# Patient Record
Sex: Male | Born: 1960 | Race: Black or African American | Hispanic: No | Marital: Married | Smoking: Former smoker
Health system: Southern US, Community
[De-identification: ages and names within clinical notes are randomized; demographics above are authoritative.]

## PROBLEM LIST (undated history)

## (undated) DIAGNOSIS — Z789 Other specified health status: Secondary | ICD-10-CM

## (undated) HISTORY — PX: OTHER SURGICAL HISTORY: SHX169

## (undated) HISTORY — PX: NO PAST SURGERIES: SHX2092

## (undated) HISTORY — DX: Other specified health status: Z78.9

---

## 2021-07-07 DIAGNOSIS — R7612 Nonspecific reaction to cell mediated immunity measurement of gamma interferon antigen response without active tuberculosis: Secondary | ICD-10-CM

## 2021-07-07 HISTORY — DX: Nonspecific reaction to cell mediated immunity measurement of gamma interferon antigen response without active tuberculosis: R76.12

## 2021-07-12 ENCOUNTER — Telehealth: Payer: Self-pay

## 2021-07-12 NOTE — Telephone Encounter (Signed)
Calling pt regarding positive T-Spot, Epi, Chest x-ray, LTBI. ?Will be transferring to [facility]. ?

## 2021-07-13 NOTE — Telephone Encounter (Addendum)
Phone call to pt with language line swahili interpreter.  ? ?Epi completed 07/14/21 - see document. ? ?Chest x-ray ordered. ? ?Pt is interested in LTBI. ? ?Per pt request, contacted case worker, Garlan Fillers, [place], [phone].  Left message requesting help getting pt transported to [facility] for Chest x-ray, address provided, and [facility] contact info supplied. ?

## 2021-07-14 ENCOUNTER — Ambulatory Visit (LOCAL_COMMUNITY_HEALTH_CENTER): Payer: Self-pay

## 2021-07-14 DIAGNOSIS — R7612 Nonspecific reaction to cell mediated immunity measurement of gamma interferon antigen response without active tuberculosis: Secondary | ICD-10-CM

## 2021-07-14 NOTE — Progress Notes (Signed)
The information obtained and confirmed in this document was obtained during a phone interview with pt. ? ?Pt unsure of ht and wt. ? ?Not taking any medications or vitamins. ? ?Chest x-ray ordered. ? ?Pt requests assistance with getting transportation set up with John at [facility] for chest x-ray. ?Phone call to Garlan Fillers, C[facility], [phone]. ? ?Discussed active vs latent TB. LTBI medication discussed. Pt is interested in LTBI medication, if applicable. ?

## 2021-07-22 ENCOUNTER — Telehealth: Payer: Self-pay | Admitting: Surgery

## 2021-07-22 DIAGNOSIS — R7612 Nonspecific reaction to cell mediated immunity measurement of gamma interferon antigen response without active tuberculosis: Secondary | ICD-10-CM

## 2021-07-22 NOTE — Telephone Encounter (Signed)
Asked case worker from [place] if he could help with transportation of patient to [facility] for CXR. Order is in system, can walk in anytime. John B said he would help patient get there next week. ? ?+T-Spot 07/07/2021 ?EPI 07/14/2021 ?Needs CXR ?Offer LTBI after CXR assuming negative ? ?Leigh Aurora, MD  ?

## 2021-07-27 ENCOUNTER — Ambulatory Visit
Admission: RE | Admit: 2021-07-27 | Discharge: 2021-07-27 | Disposition: A | Discharge status: Home / Self Care | Payer: Medicaid Other | Attending: Family Medicine | Admitting: Family Medicine

## 2021-07-27 ENCOUNTER — Ambulatory Visit
Admission: RE | Admit: 2021-07-27 | Discharge: 2021-07-27 | Disposition: A | Discharge status: Home / Self Care | Payer: Medicaid Other | Source: Ambulatory Visit | Attending: Family Medicine | Admitting: Family Medicine

## 2021-07-27 DIAGNOSIS — R7612 Nonspecific reaction to cell mediated immunity measurement of gamma interferon antigen response without active tuberculosis: Secondary | ICD-10-CM | POA: Insufficient documentation

## 2021-08-03 ENCOUNTER — Other Ambulatory Visit: Payer: Self-pay | Admitting: Surgery

## 2021-08-03 ENCOUNTER — Telehealth: Payer: Self-pay | Admitting: Surgery

## 2021-08-03 DIAGNOSIS — R7612 Nonspecific reaction to cell mediated immunity measurement of gamma interferon antigen response without active tuberculosis: Secondary | ICD-10-CM

## 2021-08-03 NOTE — Progress Notes (Signed)
+  QFT: T-spot 07/07/2021 ?CXR: negative 07/27/2021 ?EPI: 07/14/2021 ? ?HIV: neg 07/07/2021 ?Syphilis: neg 07/07/2021 ? ?Tuberculosis treatment orders  ?All patients are to be monitored per [place] and county TB policies.   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 Treat for latent TB per the following: ? ?Rifampin 600mg  daily by mouth x 4 months per Dr. Ernestina Patches.  ? ?No monthly or baseline labs currently indicated. Only needs labs if concerning symptoms arise or new medications reported that would require monitoring. ? ?Leigh Aurora, MD  ?

## 2021-08-03 NOTE — Telephone Encounter (Signed)
Swahili interpreter used. ? ?Explained active versus latent TB, and informed patient of having a negative CXR and no evidence of active TB disease. Explained to the patient that he/she is not sick nor contagious with TB. ? ?Offered treatment for latent TB to ensure patient does not become sick in the future with active TB.  ? ?Explained it would be a 4 month course taking two capsules of an antibiotic, Rifampin, every day.  ? ?Explained the medication, office visits and any necessary imaging or labs would be of no cost to the patient.  ? ?The patient would like to proceed with treatment for latent TB.  ? ?Appointment is scheduled for May 3rd, at 10am. ? ?Jennye Moccasin, MD  ?

## 2021-08-10 ENCOUNTER — Ambulatory Visit (LOCAL_COMMUNITY_HEALTH_CENTER): Payer: Medicaid Other | Admitting: Surgery

## 2021-08-10 VITALS — Wt 148.0 lb

## 2021-08-10 DIAGNOSIS — R7612 Nonspecific reaction to cell mediated immunity measurement of gamma interferon antigen response without active tuberculosis: Secondary | ICD-10-CM

## 2021-08-10 MED ORDER — RIFAMPIN 300 MG PO CAPS: 600.0000 mg | ORAL_CAPSULE | Freq: Every day | ORAL | 0 refills | Status: AC

## 2021-08-10 NOTE — Progress Notes (Signed)
Interpreter: Language line, Swahili ? ?The patient is a 61yo male with latent TB. He is here with his son who is also + for latent TB. ? ?Patient wants to start tx for LTBI with Rifampin 600mg  daily for 4 months. Patient has signed agreements to treat and obtain labs if necessary. ? ?Dispensed Rifampin 300mg , #60 at today's visit.  ? ?Potential side effects discussed, patient information sheet given along with contact number. Patient advised to refrain from drinking alcohol during treatment and if sexually active, to use additional birth control (condoms) since rifampin can reduce the efficacy of contraception.  ? ?If any concerning side effects arise patient instructed to stop medication and contact TB staff immediately.  ? ?The patient's next visit is scheduled for 09/07/2021 at 9:30am. ? ?The patient needs a taxi scheduled for transportation for their next visit to and from the health department.  ? ? , MD  ?

## 2021-09-07 ENCOUNTER — Ambulatory Visit (LOCAL_COMMUNITY_HEALTH_CENTER): Payer: Medicaid Other | Admitting: Surgery

## 2021-09-07 VITALS — Wt 156.5 lb

## 2021-09-07 DIAGNOSIS — R7612 Nonspecific reaction to cell mediated immunity measurement of gamma interferon antigen response without active tuberculosis: Secondary | ICD-10-CM

## 2021-09-07 MED ORDER — RIFAMPIN 300 MG PO CAPS: 600.0000 mg | ORAL_CAPSULE | Freq: Every day | ORAL | 0 refills | Status: AC

## 2021-09-07 NOTE — Progress Notes (Unsigned)
___Swahili__  language; used language line   Patient has been taking Rifampin 600 mg daily for 1 month for LTBI treatment.  Patient is doing well on current medication regimen. No n/v/f/c, eating normally, no concerning weight loss. Patient reports taking medications daily as prescribed.  Patient does report itching all over and rash for the last two weeks. Informed patient I could not be sure Rifampin is causing itching/rash but is definitely possible and if can continue taking Rifampin despite side effects, we should try. I educated patient about over the counter anti-itching remedies including topical 1% hydrocortisone and oral diphenhydramine 25mg  tablets. I printed out examples of the medication, where to buy them ([facility] or other pharmacy) and how to administer. Patient is concerned about cost, will be important to get patient established with a PCP.  Gave patient information about [facility] which can provide medical and mental health services. Gave patient and his son paper applications and told patient I will call [facility] to see if I can assist in arranging first meeting with staff and an interpreter so he can complete the application and establish care.   Patient has about 4 pills left. Patient advised to finish the remaining pills in their old bottle, then start on the next bottle of medication.  Dispensed #2 month of Rifampin for LTBI tx. Dispensed #60, 300 mg capsules.   I provided counseling today regarding the medication, we discussed the medication, the side effects and when to call clinic. Patient given the opportunity to ask questions.   Patient advised to contact [facility]/TB control phone for any concerning symptoms or questions.  Patient's next visit has been scheduled for:  June 29th at 10am  Contact patient 1 week ahead of time to confirm appointment and reschedule if necessary.  Will need taxi scheduled for transportation.   July 01, MD

## 2021-09-21 ENCOUNTER — Ambulatory Visit: Payer: Self-pay | Admitting: Gerontology

## 2021-10-06 ENCOUNTER — Ambulatory Visit (LOCAL_COMMUNITY_HEALTH_CENTER): Payer: Medicaid Other | Admitting: Surgery

## 2021-10-06 VITALS — Wt 147.5 lb

## 2021-10-06 DIAGNOSIS — R7612 Nonspecific reaction to cell mediated immunity measurement of gamma interferon antigen response without active tuberculosis: Secondary | ICD-10-CM | POA: Diagnosis not present

## 2021-10-06 MED ORDER — RIFAMPIN 300 MG PO CAPS: 600.0000 mg | ORAL_CAPSULE | Freq: Every day | ORAL | 0 refills | Status: AC

## 2021-10-06 NOTE — Progress Notes (Signed)
Swahili language; used language line  Patient has been taking Rifampin 600 mg daily for 2 months for LTBI treatment.  Patient is doing well on current medication regimen. No n/v/f/c, eating normally, no concerning weight loss. Patient reports taking medications daily as prescribed.  Patient did report rash and itching at last visit and was advised to try OTC medicine such as topical hydrocortisone cream and/or benadryl. He reports itching and rash much better with topical cream. Also reports some dry throat, at times sputums, sounds like allergies, also some chest tightness. But no cough, no fevers or chills. Discussed again establishing care with [facility] for primary care needs, he may have already gone but doesn't seem to understand how to address minor medical problems with a PCP. Will consult staff and get back to him.  Patient has 6 pills left. Patient advised to finish the remaining pills in their old bottle, then start on the next bottle of medication.  Dispensed #3 month of Rifampin for LTBI tx. Dispensed #60, 300 mg capsules.   I provided counseling today regarding the medication, we discussed the medication, the side effects and when to call clinic. Patient given the opportunity to ask questions.   Patient advised to contact [facility]/TB control phone for any concerning symptoms or questions.  Patient's next visit has been scheduled for: Thursday July 27th at 10:30 am  Contact patient 1 week ahead of time to confirm appointment and reschedule if necessary.  Will need taxi scheduled for transportation.  Jennye Moccasin, MD

## 2021-11-04 ENCOUNTER — Ambulatory Visit (LOCAL_COMMUNITY_HEALTH_CENTER): Payer: Medicaid Other | Admitting: Surgery

## 2021-11-04 VITALS — Wt 147.0 lb

## 2021-11-04 DIAGNOSIS — R7612 Nonspecific reaction to cell mediated immunity measurement of gamma interferon antigen response without active tuberculosis: Secondary | ICD-10-CM | POA: Diagnosis not present

## 2021-11-04 MED ORDER — RIFAMPIN 300 MG PO CAPS: 600.0000 mg | ORAL_CAPSULE | Freq: Every day | ORAL | 0 refills | Status: AC

## 2021-11-04 NOTE — Progress Notes (Signed)
Language: Swahili; Language line    Patient has been taking Rifampin 600 mg daily for 3 months for LTBI treatment.  Patient is doing well on current medication regimen. No n/v/f/c, eating normally, occasional loss of appetite but no concerning weight loss. Patient reports feeling of tightness in chest/trouble breathing once in a while, happens at rest, not during exertion. He has reported this sensation before, doesn't interfere with daily living, has been advised to follow up with PCP if continues to recur or gets worse. Patient reports taking medications daily as prescribed, patient has 5 more days of pills. The patient was advised to complete all of these pills before starting the next bottle.  Dispensed #4 and final month of Rifampin for LTBI tx. Dispensed #60, 300 mg capsules.   Explained to patient that will always be positive on PPD skin test and blood test for exposure to TB. If patient is asked by employer, school, or other institution to take a TB test, patient should supply proof of treatment completion and/or obtain a TB Screening at the health department or at patient's PCP. This information was also provided in writing in the patient's completion letter which was given to the patient today along with a TB treatment completion card.   All completion letters and completion card were sent for scanning into Epic, along with the patient's TB drug record ([id number]).  Patient was advised to contact [facility]/TB control phone for any concerning symptoms or questions.    Jennye Moccasin, MD

## 2022-03-08 ENCOUNTER — Ambulatory Visit: Payer: Medicaid Other

## 2022-03-11 ENCOUNTER — Emergency Department: Payer: Medicaid Other

## 2022-03-11 ENCOUNTER — Emergency Department
Admission: EM | Admit: 2022-03-11 | Discharge: 2022-03-11 | Disposition: A | Discharge status: Home / Self Care | Payer: Medicaid Other | Attending: Emergency Medicine | Admitting: Emergency Medicine

## 2022-03-11 DIAGNOSIS — R0789 Other chest pain: Secondary | ICD-10-CM | POA: Insufficient documentation

## 2022-03-11 DIAGNOSIS — Z4802 Encounter for removal of sutures: Secondary | ICD-10-CM

## 2022-03-11 DIAGNOSIS — R519 Headache, unspecified: Secondary | ICD-10-CM | POA: Insufficient documentation

## 2022-03-11 DIAGNOSIS — M79641 Pain in right hand: Secondary | ICD-10-CM | POA: Diagnosis not present

## 2022-03-11 DIAGNOSIS — M542 Cervicalgia: Secondary | ICD-10-CM | POA: Diagnosis not present

## 2022-03-11 LAB — CBC WITH DIFFERENTIAL/PLATELET
Abs Immature Granulocytes: 0.01 10*3/uL (ref 0.00–0.07)
Basophils Absolute: 0 10*3/uL (ref 0.0–0.1)
Basophils Relative: 0 %
Eosinophils Absolute: 0.2 10*3/uL (ref 0.0–0.5)
Eosinophils Relative: 3 %
HCT: 44.5 % (ref 39.0–52.0)
Hemoglobin: 14.5 g/dL (ref 13.0–17.0)
Immature Granulocytes: 0 %
Lymphocytes Relative: 50 %
Lymphs Abs: 2.6 10*3/uL (ref 0.7–4.0)
MCH: 29 pg (ref 26.0–34.0)
MCHC: 32.6 g/dL (ref 30.0–36.0)
MCV: 89 fL (ref 80.0–100.0)
Monocytes Absolute: 0.4 10*3/uL (ref 0.1–1.0)
Monocytes Relative: 7 %
Neutro Abs: 2.2 10*3/uL (ref 1.7–7.7)
Neutrophils Relative %: 40 %
Platelets: 273 10*3/uL (ref 150–400)
RBC: 5 MIL/uL (ref 4.22–5.81)
RDW: 11.4 % — ABNORMAL LOW (ref 11.5–15.5)
WBC: 5.3 10*3/uL (ref 4.0–10.5)
nRBC: 0 % (ref 0.0–0.2)

## 2022-03-11 LAB — BASIC METABOLIC PANEL
Anion gap: 5 (ref 5–15)
BUN: 16 mg/dL (ref 6–20)
CO2: 27 mmol/L (ref 22–32)
Calcium: 9.3 mg/dL (ref 8.9–10.3)
Chloride: 105 mmol/L (ref 98–111)
Creatinine, Ser: 1.23 mg/dL (ref 0.61–1.24)
GFR, Estimated: >60 mL/min (ref >60)
Glucose, Bld: 149 mg/dL — ABNORMAL HIGH (ref 70–99)
Potassium: 4.6 mmol/L (ref 3.5–5.1)
Sodium: 137 mmol/L (ref 135–145)

## 2022-03-11 LAB — TROPONIN I (HIGH SENSITIVITY): Troponin I (High Sensitivity): 4 ng/L (ref <18)

## 2022-03-11 MED ORDER — MORPHINE SULFATE (PF) 4 MG/ML IV SOLN
4.0000 mg | Freq: Once | INTRAVENOUS | Status: AC
  Administered 2022-03-11: 4 mg via INTRAVENOUS
  Filled 2022-03-11: qty 1

## 2022-03-11 MED ORDER — LIDOCAINE 5 % EX PTCH: 1.0000 | MEDICATED_PATCH | Freq: Two times a day (BID) | CUTANEOUS | 0 refills | Status: AC

## 2022-03-11 MED ORDER — IBUPROFEN 600 MG PO TABS: 600.0000 mg | ORAL_TABLET | Freq: Four times a day (QID) | ORAL | 0 refills | Status: AC | PRN

## 2022-03-11 MED ORDER — IOHEXOL 300 MG/ML  SOLN
100.0000 mL | Freq: Once | INTRAMUSCULAR | Status: AC | PRN
  Administered 2022-03-11: 100 mL via INTRAVENOUS

## 2022-03-11 NOTE — Discharge Instructions (Addendum)
IMPRESSION: 1. No acute localizing process in the chest. 2. 1.8 cm incidental right thyroid nodule. Recommend non-emergent thyroid ultrasound. Reference: J Am Coll Radiol. 2015 Feb;12(2): 143-50  Your workup was reassuring you can take Tylenol 1 g every 8 hours and ibuprofen to help with pain.  Use the pain patches.
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
## 2022-03-11 NOTE — ED Notes (Signed)
Pt sats 89% on RA. Pt wearing [place] with 0L upon RN entrance. Placed on 2L with no change, placed on 4L and at 91%.

## 2022-03-11 NOTE — ED Notes (Signed)
Used translator [phone] for discharge instructions. Vitals stable. All of pts questions answered prior to discharge.

## 2022-03-31 ENCOUNTER — Ambulatory Visit (LOCAL_COMMUNITY_HEALTH_CENTER): Payer: Medicaid Other

## 2022-03-31 DIAGNOSIS — Z23 Encounter for immunization: Secondary | ICD-10-CM | POA: Diagnosis not present

## 2022-03-31 DIAGNOSIS — Z719 Counseling, unspecified: Secondary | ICD-10-CM

## 2022-03-31 NOTE — Progress Notes (Signed)
[employer] Language line used, Interpreter badge number:  [id number] Patient seen for the following immunizations: Flu, Hep B, Hep A, Polio SQ VIS forms given. [facility] immunization copy given. After care reviewed.  

## 2022-04-21 ENCOUNTER — Other Ambulatory Visit: Payer: Self-pay | Admitting: Family Medicine

## 2022-04-21 DIAGNOSIS — E041 Nontoxic single thyroid nodule: Secondary | ICD-10-CM

## 2022-04-27 ENCOUNTER — Encounter: Payer: Self-pay | Admitting: Emergency Medicine

## 2022-04-27 ENCOUNTER — Other Ambulatory Visit: Payer: Self-pay

## 2022-04-27 ENCOUNTER — Emergency Department
Admission: EM | Admit: 2022-04-27 | Discharge: 2022-04-27 | Disposition: A | Discharge status: Home / Self Care | Payer: Medicaid Other | Attending: Emergency Medicine | Admitting: Emergency Medicine

## 2022-04-27 DIAGNOSIS — S199XXA Unspecified injury of neck, initial encounter: Secondary | ICD-10-CM | POA: Diagnosis present

## 2022-04-27 DIAGNOSIS — S161XXA Strain of muscle, fascia and tendon at neck level, initial encounter: Secondary | ICD-10-CM | POA: Diagnosis not present

## 2022-04-27 MED ORDER — HYDROXYZINE HCL 10 MG PO TABS: 10.0000 mg | ORAL_TABLET | Freq: Three times a day (TID) | ORAL | 0 refills | Status: DC | PRN

## 2022-04-27 MED ORDER — PREDNISONE 10 MG (21) PO TBPK: ORAL_TABLET | ORAL | 0 refills | Status: DC
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
## 2022-05-05 ENCOUNTER — Ambulatory Visit: Payer: Medicaid Other

## 2022-05-05 ENCOUNTER — Ambulatory Visit (LOCAL_COMMUNITY_HEALTH_CENTER): Payer: Medicaid Other

## 2022-05-05 DIAGNOSIS — Z23 Encounter for immunization: Secondary | ICD-10-CM

## 2022-05-05 NOTE — Progress Notes (Signed)
[employer] Language line used, Interpreter agent name and badge number: Beverlee Nims [phone]. Patient seen for the following immunizations: IPV VIS forms given. [facility] immunization copy given. Tolerated well.

## 2022-08-29 ENCOUNTER — Ambulatory Visit: Payer: Self-pay | Admitting: *Deleted
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
## 2022-10-11 ENCOUNTER — Ambulatory Visit: Payer: Medicaid Other | Admitting: Physician Assistant

## 2022-10-11 ENCOUNTER — Encounter: Payer: Self-pay | Admitting: Physician Assistant

## 2022-10-11 VITALS — BP 115/81 | HR 10 | Ht 66.93 in | Wt 153.5 lb

## 2022-10-11 DIAGNOSIS — R1031 Right lower quadrant pain: Secondary | ICD-10-CM | POA: Diagnosis not present

## 2022-10-11 DIAGNOSIS — R109 Unspecified abdominal pain: Secondary | ICD-10-CM

## 2022-10-11 DIAGNOSIS — R5383 Other fatigue: Secondary | ICD-10-CM

## 2022-10-11 DIAGNOSIS — R102 Pelvic and perineal pain: Secondary | ICD-10-CM

## 2022-10-11 DIAGNOSIS — Z136 Encounter for screening for cardiovascular disorders: Secondary | ICD-10-CM | POA: Diagnosis not present

## 2022-10-11 DIAGNOSIS — R81 Glycosuria: Secondary | ICD-10-CM

## 2022-10-11 LAB — POCT URINALYSIS DIPSTICK
Bilirubin, UA: NEGATIVE
Blood, UA: NEGATIVE
Glucose, UA: POSITIVE — AB
Ketones, UA: NEGATIVE
Nitrite, UA: POSITIVE
Protein, UA: NEGATIVE
Spec Grav, UA: 1.01 (ref 1.010–1.025)
Urobilinogen, UA: 0.2 E.U./dL
pH, UA: 6 (ref 5.0–8.0)
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
## 2022-10-13 ENCOUNTER — Encounter: Payer: Self-pay | Admitting: Physician Assistant

## 2022-10-13 DIAGNOSIS — R109 Unspecified abdominal pain: Secondary | ICD-10-CM

## 2022-10-13 DIAGNOSIS — R1031 Right lower quadrant pain: Secondary | ICD-10-CM

## 2022-10-13 DIAGNOSIS — R81 Glycosuria: Secondary | ICD-10-CM | POA: Insufficient documentation

## 2022-10-13 DIAGNOSIS — R5383 Other fatigue: Secondary | ICD-10-CM | POA: Insufficient documentation

## 2022-10-13 HISTORY — DX: Right lower quadrant pain: R10.31

## 2022-10-13 HISTORY — DX: Unspecified abdominal pain: R10.9

## 2022-10-17 LAB — CBC WITH DIFFERENTIAL/PLATELET
Basophils Absolute: 0 10*3/uL (ref 0.0–0.2)
Basos: 1 %
EOS (ABSOLUTE): 0.2 10*3/uL (ref 0.0–0.4)
Eos: 3 %
Hematocrit: 42.4 % (ref 37.5–51.0)
Hemoglobin: 14.2 g/dL (ref 13.0–17.7)
Immature Grans (Abs): 0 10*3/uL (ref 0.0–0.1)
Immature Granulocytes: 0 %
Lymphocytes Absolute: 2.6 10*3/uL (ref 0.7–3.1)
Lymphs: 46 %
MCH: 29.3 pg (ref 26.6–33.0)
MCHC: 33.5 g/dL (ref 31.5–35.7)
MCV: 87 fL (ref 79–97)
Monocytes Absolute: 0.4 10*3/uL (ref 0.1–0.9)
Monocytes: 7 %
Neutrophils Absolute: 2.3 10*3/uL (ref 1.4–7.0)
Neutrophils: 43 %
Platelets: 221 10*3/uL (ref 150–450)
RBC: 4.85 x10E6/uL (ref 4.14–5.80)
RDW: 11.4 % — ABNORMAL LOW (ref 11.6–15.4)
WBC: 5.5 10*3/uL (ref 3.4–10.8)

## 2022-10-17 LAB — COMPREHENSIVE METABOLIC PANEL
ALT: 18 IU/L (ref 0–44)
AST: 17 IU/L (ref 0–40)
Albumin: 4.3 g/dL (ref 3.9–4.9)
Alkaline Phosphatase: 100 IU/L (ref 44–121)
BUN/Creatinine Ratio: 13 (ref 10–24)
BUN: 16 mg/dL (ref 8–27)
Bilirubin Total: 0.9 mg/dL (ref 0.0–1.2)
CO2: 23 mmol/L (ref 20–29)
Calcium: 9.7 mg/dL (ref 8.6–10.2)
Chloride: 99 mmol/L (ref 96–106)
Creatinine, Ser: 1.2 mg/dL (ref 0.76–1.27)
Globulin, Total: 3.6 g/dL (ref 1.5–4.5)
Glucose: 306 mg/dL — ABNORMAL HIGH (ref 70–99)
Potassium: 4.4 mmol/L (ref 3.5–5.2)
Sodium: 137 mmol/L (ref 134–144)
Total Protein: 7.9 g/dL (ref 6.0–8.5)
eGFR: 69 mL/min/{1.73_m2} (ref >59)

## 2022-10-17 LAB — HEMOGLOBIN A1C
Est. average glucose Bld gHb Est-mCnc: 212 mg/dL
Hgb A1c MFr Bld: 9 % — ABNORMAL HIGH (ref 4.8–5.6)

## 2022-10-17 LAB — TSH: TSH: 3.16 u[IU]/mL (ref 0.450–4.500)

## 2022-10-18 ENCOUNTER — Other Ambulatory Visit: Payer: Self-pay | Admitting: Physician Assistant

## 2022-10-18 DIAGNOSIS — E119 Type 2 diabetes mellitus without complications: Secondary | ICD-10-CM

## 2022-10-18 MED ORDER — METFORMIN HCL 500 MG PO TABS: 500.0000 mg | ORAL_TABLET | Freq: Two times a day (BID) | ORAL | 1 refills | Status: DC

## 2022-10-18 NOTE — Progress Notes (Signed)
(  Needs translator)Please, let pt know that he has diabetes and a med will be sent to his pharmacy? He needs to schedule an appt in a mo or 6 weeks.

## 2022-10-19 ENCOUNTER — Telehealth: Payer: Self-pay | Admitting: Physician Assistant

## 2022-10-19 NOTE — Progress Notes (Signed)
(  Translator from/to Swahili) Pt needs to start Metformin for his newly diagnosed diabetes. Please, disregard previous messages to p Shealynn Saulnier nurse. Need a fu appt in 6 weeks.

## 2022-10-19 NOTE — Telephone Encounter (Signed)
Using Swahili interpreter [place], # [phone], given lab results and instructions. Follow up appointment made.

## 2022-10-24 ENCOUNTER — Ambulatory Visit
Admission: RE | Admit: 2022-10-24 | Discharge: 2022-10-24 | Disposition: A | Discharge status: Home / Self Care | Payer: Medicaid Other | Source: Ambulatory Visit | Attending: Physician Assistant | Admitting: Physician Assistant

## 2022-10-24 DIAGNOSIS — R1031 Right lower quadrant pain: Secondary | ICD-10-CM | POA: Diagnosis present

## 2022-10-24 DIAGNOSIS — R109 Unspecified abdominal pain: Secondary | ICD-10-CM | POA: Diagnosis present

## 2022-10-24 DIAGNOSIS — R102 Pelvic and perineal pain: Secondary | ICD-10-CM

## 2022-10-24 NOTE — Progress Notes (Signed)
Translator. Please, let pt know normal right upper quadrant [place].

## 2022-10-26 ENCOUNTER — Other Ambulatory Visit: Payer: Self-pay | Admitting: Physician Assistant

## 2022-10-31 NOTE — Progress Notes (Signed)
(  Translator from to swahili) This is a repeated message. Pt needs to do urine sample again for urine culture

## 2022-11-08 ENCOUNTER — Telehealth: Payer: Self-pay | Admitting: Physician Assistant

## 2022-11-08 NOTE — Telephone Encounter (Signed)
Pt's niece Vivia Ewing called requesting to receive a diabetic testing machine to   [facility] [address] [place] - [address]  [address] [place] [address]  Phone: [phone] Fax: [phone]

## 2022-11-09 ENCOUNTER — Other Ambulatory Visit: Payer: Self-pay

## 2022-11-09 DIAGNOSIS — E119 Type 2 diabetes mellitus without complications: Secondary | ICD-10-CM

## 2022-11-10 ENCOUNTER — Other Ambulatory Visit: Payer: Self-pay | Admitting: Physician Assistant

## 2022-11-10 ENCOUNTER — Telehealth: Payer: Self-pay | Admitting: Physician Assistant

## 2022-11-10 ENCOUNTER — Ambulatory Visit (LOCAL_COMMUNITY_HEALTH_CENTER): Payer: Medicaid Other

## 2022-11-10 DIAGNOSIS — Z719 Counseling, unspecified: Secondary | ICD-10-CM

## 2022-11-10 DIAGNOSIS — Z23 Encounter for immunization: Secondary | ICD-10-CM

## 2022-11-10 DIAGNOSIS — Z0289 Encounter for other administrative examinations: Secondary | ICD-10-CM

## 2022-11-10 DIAGNOSIS — E119 Type 2 diabetes mellitus without complications: Secondary | ICD-10-CM

## 2022-11-10 MED ORDER — FREESTYLE LIBRE 3 SENSOR MISC: 11 refills | Status: DC

## 2022-11-10 NOTE — Telephone Encounter (Signed)
Covermymeds is requesting prior authorization Key: [id number] Name: [employer] 3 Sensor has been rejected & requires PA

## 2022-11-10 NOTE — Progress Notes (Signed)
Client here to complete I-693 form and to obtain any needed immunizations, the following vaccines given at the time of visit: polio, spikevax Copy of completed I-693 form provided, VIS provided. Vaccine after care information given.
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
## 2022-11-20 ENCOUNTER — Encounter: Payer: Self-pay | Admitting: Physician Assistant

## 2022-11-20 ENCOUNTER — Ambulatory Visit (INDEPENDENT_AMBULATORY_CARE_PROVIDER_SITE_OTHER): Payer: Medicaid Other | Admitting: Physician Assistant

## 2022-11-20 VITALS — BP 118/89 | HR 83 | Ht 65.5 in | Wt 150.1 lb

## 2022-11-20 DIAGNOSIS — R109 Unspecified abdominal pain: Secondary | ICD-10-CM

## 2022-11-20 DIAGNOSIS — E119 Type 2 diabetes mellitus without complications: Secondary | ICD-10-CM | POA: Diagnosis not present

## 2022-11-20 DIAGNOSIS — N390 Urinary tract infection, site not specified: Secondary | ICD-10-CM | POA: Diagnosis not present

## 2022-11-20 DIAGNOSIS — K5909 Other constipation: Secondary | ICD-10-CM

## 2022-11-20 DIAGNOSIS — R1031 Right lower quadrant pain: Secondary | ICD-10-CM

## 2022-11-20 DIAGNOSIS — Z758 Other problems related to medical facilities and other health care: Secondary | ICD-10-CM

## 2022-11-20 DIAGNOSIS — Z5986 Financial insecurity: Secondary | ICD-10-CM

## 2022-11-20 HISTORY — DX: Type 2 diabetes mellitus without complications: E11.9

## 2022-11-20 HISTORY — DX: Other problems related to medical facilities and other health care: Z75.8

## 2022-11-20 HISTORY — DX: Other constipation: K59.09

## 2022-11-20 LAB — POCT URINALYSIS DIPSTICK
Bilirubin, UA: POSITIVE
Glucose, UA: POSITIVE — AB
Ketones, UA: NEGATIVE
Nitrite, UA: POSITIVE
Protein, UA: POSITIVE — AB
Spec Grav, UA: 1.015 (ref 1.010–1.025)
Urobilinogen, UA: 0.2 E.U./dL
pH, UA: 6 (ref 5.0–8.0)

## 2022-11-20 MED ORDER — CEPHALEXIN 500 MG PO CAPS
500.0000 mg | ORAL_CAPSULE | Freq: Two times a day (BID) | ORAL | 0 refills | Status: DC
Start: 2022-11-20 — End: 2022-12-04

## 2022-11-20 NOTE — Telephone Encounter (Signed)
Outcome Denied today by [employer] [place] Case: [id number], Status: Denied. Notification: Completed. Drug FreeStyle Libre 3 Sensor

## 2022-11-20 NOTE — Telephone Encounter (Signed)
PA initiated

## 2022-11-21 LAB — BASIC METABOLIC PANEL WITH GFR
BUN/Creatinine Ratio: 10 (ref 10–24)
BUN: 10 mg/dL (ref 8–27)
CO2: 21 mmol/L (ref 20–29)
Calcium: 9.9 mg/dL (ref 8.6–10.2)
Chloride: 103 mmol/L (ref 96–106)
Creatinine, Ser: 1.05 mg/dL (ref 0.76–1.27)
Glucose: 135 mg/dL — ABNORMAL HIGH (ref 70–99)
Potassium: 5.2 mmol/L (ref 3.5–5.2)
Sodium: 141 mmol/L (ref 134–144)
eGFR: 81 mL/min/{1.73_m2}

## 2022-11-23 NOTE — Addendum Note (Signed)
Addended by: Debera Lat on: 11/23/2022 05:12 PM   Modules accepted: Orders

## 2022-11-24 NOTE — Addendum Note (Signed)
Addended by: Debera Lat on: 11/24/2022 05:07 PM   Modules accepted: Orders

## 2022-11-27 ENCOUNTER — Telehealth: Payer: Self-pay

## 2022-11-27 DIAGNOSIS — N3090 Cystitis, unspecified without hematuria: Secondary | ICD-10-CM

## 2022-11-27 DIAGNOSIS — R109 Unspecified abdominal pain: Secondary | ICD-10-CM

## 2022-11-27 NOTE — Addendum Note (Signed)
Addended by: Debera Lat on: 11/27/2022 11:42 AM   Modules accepted: Orders

## 2022-11-27 NOTE — Progress Notes (Signed)
Needs interpreter swahili: E.coli not susceptible to oral abx was found. Referral to infect disease was placed.

## 2022-11-27 NOTE — Telephone Encounter (Signed)
Interpreter called patient with myself on the line. Patient was given results and had further questions regarding stomach test, stated he had tests done at another facility as well that was referred by you. Please advise

## 2022-11-27 NOTE — Addendum Note (Signed)
Addended by: Debera Lat on: 11/27/2022 07:48 AM   Modules accepted: Orders

## 2022-11-27 NOTE — Telephone Encounter (Signed)
-----   Message from Debera Lat sent at 11/27/2022  7:49 AM EDT ----- Needs interpreter swahili: E.coli not susceptible to oral abx was found. Referral to infect disease was placed.

## 2022-11-28 ENCOUNTER — Ambulatory Visit: Payer: Medicaid Other | Admitting: Infectious Diseases

## 2022-11-30 ENCOUNTER — Ambulatory Visit: Payer: Medicaid Other | Admitting: Infectious Diseases

## 2022-12-01 ENCOUNTER — Ambulatory Visit: Admission: RE | Admit: 2022-12-01 | Payer: Medicaid Other | Source: Ambulatory Visit

## 2022-12-01 DIAGNOSIS — R109 Unspecified abdominal pain: Secondary | ICD-10-CM | POA: Insufficient documentation

## 2022-12-04 ENCOUNTER — Encounter: Payer: Self-pay | Admitting: Physician Assistant

## 2022-12-04 ENCOUNTER — Ambulatory Visit (INDEPENDENT_AMBULATORY_CARE_PROVIDER_SITE_OTHER): Payer: Medicaid Other | Admitting: Physician Assistant

## 2022-12-04 VITALS — BP 103/85 | HR 78 | Temp 98.3°F | Resp 12 | Ht 65.5 in | Wt 151.1 lb

## 2022-12-04 DIAGNOSIS — E119 Type 2 diabetes mellitus without complications: Secondary | ICD-10-CM

## 2022-12-04 DIAGNOSIS — G8929 Other chronic pain: Secondary | ICD-10-CM

## 2022-12-04 DIAGNOSIS — Z758 Other problems related to medical facilities and other health care: Secondary | ICD-10-CM | POA: Diagnosis not present

## 2022-12-04 DIAGNOSIS — R1031 Right lower quadrant pain: Secondary | ICD-10-CM

## 2022-12-04 DIAGNOSIS — N390 Urinary tract infection, site not specified: Secondary | ICD-10-CM

## 2022-12-04 DIAGNOSIS — M545 Low back pain, unspecified: Secondary | ICD-10-CM | POA: Diagnosis not present

## 2022-12-04 DIAGNOSIS — Z5986 Financial insecurity: Secondary | ICD-10-CM

## 2022-12-04 MED ORDER — LANCETS MISC. MISC: 1.0000 | Freq: Three times a day (TID) | 0 refills | Status: AC

## 2022-12-04 MED ORDER — BLOOD GLUCOSE MONITORING SUPPL DEVI: 1.0000 | Freq: Three times a day (TID) | 0 refills | Status: DC

## 2022-12-04 MED ORDER — BLOOD GLUCOSE TEST VI STRP: 1.0000 | ORAL_STRIP | Freq: Three times a day (TID) | 0 refills | Status: AC

## 2022-12-04 NOTE — Progress Notes (Unsigned)
  Established patient visit  Patient: Vincent [name]   DOB: 1961-01-29   62 y.o. Male  MRN: [id number] Visit Date: 12/04/2022  Today's healthcare provider: Debera Lat, PA-C   No chief complaint on file.  Subjective    HPI HPI   Patient c/o recurrent testicular pain radiating to his abdomen.  Recurrent cough and shortness of breath x 2 years.  One episode of back pain/pop 2- 3 months ago.  Patient reports did not get continuous glucose sensor. Last edited by Myles Lipps, CMA on 12/04/2022  2:54 PM.      *** Discussed the use of AI scribe software for clinical note transcription with the patient, who gave verbal consent to proceed.  History of Present Illness         Surveyor, mining from to swahili       No data to display             No data to display          Medications: Outpatient Medications Prior to Visit  Medication Sig   metFORMIN (GLUCOPHAGE) 500 MG tablet Take 1 tablet (500 mg total) by mouth 2 (two) times daily with a meal.   Continuous Glucose Sensor (FREESTYLE LIBRE 3 SENSOR) MISC Place 1 sensor on the skin every 14 days. Use to check glucose continuously (Patient not taking: Reported on 12/04/2022)   hydrOXYzine (ATARAX) 10 MG tablet Take 1 tablet (10 mg total) by mouth 3 (three) times daily as needed. (Patient not taking: Reported on 10/11/2022)   [DISCONTINUED] cephALEXin (KEFLEX) 500 MG capsule Take 1 capsule (500 mg total) by mouth 2 (two) times daily.   [DISCONTINUED] predniSONE (STERAPRED UNI-PAK 21 TAB) 10 MG (21) TBPK tablet Take 6 tablets on the first day and decrease by 1 tablet each day until finished. (Patient not taking: Reported on 10/11/2022)   No facility-administered medications prior to visit.    Review of Systems Except see HPI     {See past labs  Heme  Chem  Endocrine  Serology  Results Review (optional):1}   Objective    BP 103/85 (BP Location: Left Arm, Patient Position: Sitting, Cuff Size: Normal)    Pulse 78   Temp 98.3 F (36.8 C) (Temporal)   Resp 12   Ht 5' 5.5" (1.664 m)   Wt 151 lb 1.6 oz (68.5 kg)   SpO2 96%   BMI 24.76 kg/m   {See vitals history (optional):1}   Physical Exam   No results found for any visits on 12/04/22.  Assessment & Plan    *** Assessment and Plan              No follow-ups on file.      [facility]

## 2022-12-05 ENCOUNTER — Ambulatory Visit: Payer: Medicaid Other | Admitting: Infectious Diseases

## 2022-12-05 DIAGNOSIS — G8929 Other chronic pain: Secondary | ICD-10-CM | POA: Insufficient documentation

## 2022-12-05 DIAGNOSIS — M545 Low back pain, unspecified: Secondary | ICD-10-CM

## 2022-12-05 HISTORY — DX: Low back pain, unspecified: M54.50

## 2022-12-05 HISTORY — DX: Other chronic pain: G89.29

## 2022-12-08 ENCOUNTER — Ambulatory Visit: Payer: Medicaid Other

## 2022-12-08 NOTE — Progress Notes (Signed)
CT scan showed no acute abdominal or pelvic abnormalities.  Except Bladder wall thickening which can be indicative of hypertrophy or inflammation due to various causes such as cystitis.  Referral to urology will be placed for evaluation.

## 2022-12-08 NOTE — Addendum Note (Signed)
Addended by: Debera Lat on: 12/08/2022 05:30 PM   Modules accepted: Orders

## 2022-12-12 ENCOUNTER — Telehealth: Payer: Self-pay

## 2022-12-12 NOTE — Progress Notes (Signed)
   Care Guide Note  12/12/2022 Name: Vincent [name] MRN: [id number] DOB: Oct 28, 1960  Referred by: Debera Lat, PA-C Reason for referral : Care Coordination (Outreach to schedule with Pharm d )   Vincent [name] is a 62 y.o. year old male who is a primary care patient of Debera Lat, [place]. Vincent [name] was referred to the pharmacist for assistance related to DM.    Successful contact was made with the patient to discuss pharmacy services including being ready for the pharmacist to call at least 5 minutes before the scheduled appointment time, to have medication bottles and any blood sugar or blood pressure readings ready for review. The patient agreed to meet with the pharmacist via with the pharmacist via telephone visit on (date/time).  12/13/2022  Penne Lash, RMA Care Guide [facility]  [place], [place] [address] Direct Dial: [phone] [email or web address]

## 2022-12-13 ENCOUNTER — Other Ambulatory Visit: Payer: Medicaid Other | Admitting: Pharmacist

## 2022-12-13 NOTE — Progress Notes (Signed)
12/13/2022 Name: Vincent [name] MRN: [id number] DOB: 12/12/1960    Vincent [name] is a 62 y.o. year old male who presented for a telephone visit. Used [name] for swahili,    They were referred to the pharmacist by their Case Management Team  for assistance in managing diabetes and medication access.    Subjective:  Care Team: Primary Care Provider: Debera Lat, PA-C   Medication Access/Adherence  Current Pharmacy:  [facility] [address] [place] - [address] [place] [address] Phone: [phone] Fax: [phone]   Patient reports affordability concerns with their medications: No  Patient reports access/transportation concerns to their pharmacy: No  Patient reports adherence concerns with their medications:  No    Diabetes:  Current medications: metformin 500mg  BID, new diagnosis of diabetes.   Current glucose readings: unable to utilize CGM sample from provider, found it confusing. He is familiar with how to do traditional fingerstick glucometer and prefers this, but states the prescription is not at his [facility].  Current medication access support: none at present   Objective:  Lab Results  Component Value Date   HGBA1C 9.0 (H) 10/16/2022    Lab Results  Component Value Date   CREATININE 1.05 11/20/2022   BUN 10 11/20/2022   NA 141 11/20/2022   K 5.2 11/20/2022   CL 103 11/20/2022   CO2 21 11/20/2022    No results found for: "CHOL", "HDL", "LDLCALC", "LDLDIRECT", "TRIG", "CHOLHDL"  Medications Reviewed Today     Reviewed by Gabriel Carina, [facility] (Pharmacist) on 12/13/22 at 1412  Med List Status: <None>   Medication Order Taking? Sig Documenting Provider Last Dose Status Informant  Blood Glucose Monitoring Suppl DEVI [id number]  1 each by Does not apply route in the morning, at noon, and at bedtime. May substitute to any manufacturer covered by patient's insurance. Debera Lat, PA-C  Active    Patient not taking:  Discontinued 12/13/22 1412 (Patient Preference)   Glucose Blood (BLOOD GLUCOSE TEST STRIPS) STRP [id number]  1 each by In Vitro route in the morning, at noon, and at bedtime. May substitute to any manufacturer covered by patient's insurance. Debera Lat, PA-C  Active   hydrOXYzine (ATARAX) 10 MG tablet [id number] No Take 1 tablet (10 mg total) by mouth 3 (three) times daily as needed.  Patient not taking: Reported on 10/11/2022   Chinita Pester, FNP Not Taking Active   Lancets Misc. MISC [id number]  1 each by Does not apply route in the morning, at noon, and at bedtime. May substitute to any manufacturer covered by patient's insurance. Debera Lat, PA-C  Active   metFORMIN (GLUCOPHAGE) 500 MG tablet [id number] Yes Take 1 tablet (500 mg total) by mouth 2 (two) times daily with a meal. Ostwalt, Janna, PA-C Taking Active             Assessment/Plan:   Diabetes: - Currently uncontrolled, metformin initiated, continue current regimen - Patient prefers traditional glucometer (not CGM), also of note a cost barrier, insurance will not cover cost of CGM unless patient is on insulin. Patient expressed challenges with $4 copay, at future visit will consider charge account pharmacy options. He does like that his current [facility] is very short distance, a 3 minute walk on-foot, from his home.  - Contacted [facility] pharmacy, who did not have eRx for glucometer- provided verbal order from Ostwalt previous glucometer order on 12/04/22.  - Recommend to check glucose 1-2 times daily, will contact patient in 1  week to ensure he picks up glucometer and is supported with incorporating this new habit into daily living.  Follow Up Plan: 1 week  Lynnda Shields, PharmD, BCPS Clinical Pharmacist [facility]

## 2022-12-13 NOTE — Patient Instructions (Addendum)
Mr. Fish,  I have arranged a traditional glucometer prescription at your [facility] pharmacy. Please pick it up at your convenience, and I will call again in 1-2 weeks to assist if you have any questions or need support.  Thank you, Thurston Hole

## 2022-12-14 ENCOUNTER — Telehealth: Payer: Self-pay

## 2022-12-14 NOTE — Telephone Encounter (Signed)
-----   Message from [place] sent at 12/08/2022  5:26 PM EDT ----- CT scan showed no acute abdominal or pelvic abnormalities.  Except Bladder wall thickening which can be indicative of hypertrophy or inflammation due to various causes such as cystitis.  Referral to urology will be placed for evaluation.

## 2022-12-14 NOTE — Telephone Encounter (Signed)
Tried contacting pat 2x via interpreter but pt VM is full so we could not leave a message

## 2022-12-19 ENCOUNTER — Ambulatory Visit: Payer: Medicaid Other | Admitting: Infectious Diseases

## 2022-12-20 ENCOUNTER — Other Ambulatory Visit: Payer: Medicaid Other | Admitting: Pharmacist

## 2022-12-20 NOTE — Progress Notes (Signed)
12/20/2022 Name: Vincent [name] MRN: [id number] DOB: Aug 26, 1960   Vincent [name] is a 62 y.o. year old male who presented for a telephone visit. Used [name] for swahili, [id number] ID.   They were referred to the pharmacist by their Case Management Team  for assistance in managing diabetes and medication access.    Subjective:  Care Team: Primary Care Provider: Debera Lat, PA-C   Medication Access/Adherence  Current Pharmacy:  [facility] [address] [place] - [address] [place] [address] Phone: [phone] Fax: [phone]   Patient reports affordability concerns with their medications: No  Patient reports access/transportation concerns to their pharmacy: No  Patient reports adherence concerns with their medications:  No    Diabetes:  Current medications: metformin 500mg  BID, new diagnosis of diabetes.   Current glucose readings: 132ing fasting, 158 postprandial Using traditional meter, we facilitated at last visit and he has successfully implemented checking!  Current medication access support: none at present. Patient identifies financial strain to afford $4 copays- requesting help   Objective:  Lab Results  Component Value Date   HGBA1C 9.0 (H) 10/16/2022    Lab Results  Component Value Date   CREATININE 1.05 11/20/2022   BUN 10 11/20/2022   NA 141 11/20/2022   K 5.2 11/20/2022   CL 103 11/20/2022   CO2 21 11/20/2022     Medications Reviewed Today     Reviewed by Vincent [name], RPH (Pharmacist) on 12/20/22 at 1039  Med List Status: <None>   Medication Order Taking? Sig Documenting Provider Last Dose Status Informant  Blood Glucose Monitoring Suppl DEVI [id number]  1 each by Does not apply route in the morning, at noon, and at bedtime. May substitute to any manufacturer covered by patient's insurance. Vincent Lat, PA-C  Active   Glucose Blood (BLOOD GLUCOSE TEST STRIPS) STRP [id number]  1 each  by In Vitro route in the morning, at noon, and at bedtime. May substitute to any manufacturer covered by patient's insurance. Vincent Lat, PA-C  Active   hydrOXYzine (ATARAX) 10 MG tablet [id number] No Take 1 tablet (10 mg total) by mouth 3 (three) times daily as needed.  Patient not taking: Reported on 10/11/2022   Vincent Pester, FNP Not Taking Active   Lancets Misc. MISC [id number]  1 each by Does not apply route in the morning, at noon, and at bedtime. May substitute to any manufacturer covered by patient's insurance. Vincent Lat, PA-C  Active   metFORMIN (GLUCOPHAGE) 500 MG tablet [id number] No Take 1 tablet (500 mg total) by mouth 2 (two) times daily with a meal. [name], Janna, PA-C Taking Active             Assessment/Plan:   Diabetes: - Currently uncontrolled, metformin initiated, continue current regimen. Patient has obtained glucometer and checking blood sugars successfully. - Recommend increase metformin dose at October PCP visit, will collaborate with provider - Patient expressed challenges with $4 copay, will connect with Vincent [name] for SDOH and social work needs.  - At future visit will consider charge account pharmacy options. He does like that [facility] current [facility] is very short distance, a 3 minute walk on-foot, from his home. Uncertain if [facility] pharmacy offers charge account. Will investigate, versus transfer medications to [facility] for charge account. Tried to [facility] x2, they dropped the calls. - Recommend to check glucose 1-2 times daily as current  Follow Up Plan: 2 weeks for ongoing med access/SDOH barriers, difficulty affording $  4 copays  Vincent [name], PharmD, BCPS Clinical Pharmacist [facility]

## 2022-12-21 ENCOUNTER — Ambulatory Visit (INDEPENDENT_AMBULATORY_CARE_PROVIDER_SITE_OTHER): Payer: Medicaid Other | Admitting: Orthopaedic Surgery

## 2022-12-21 DIAGNOSIS — M545 Low back pain, unspecified: Secondary | ICD-10-CM | POA: Diagnosis not present

## 2022-12-21 MED ORDER — METHOCARBAMOL 500 MG PO TABS: 500.0000 mg | ORAL_TABLET | Freq: Four times a day (QID) | ORAL | 3 refills | Status: DC

## 2022-12-21 NOTE — Progress Notes (Signed)
Chief Complaint: Lower back pain     History of Present Illness:    Vincent [name] is a 62 y.o. male presents with lower back pain which is ongoing for last several months.  Did not have a specific injury but set up and felt a pop in the back.  Denies any radiating pain down the legs.  He does have a mole on the back pain as well which she has to be assessed today.  Denies any history of back pain or lumbar radiculopathy.  Has not had any treatment for this.  He speaks Swahili.    Surgical History:   None  PMH/PSH/Family History/Social History/Meds/Allergies:    Past Medical History:  Diagnosis Date   Patient denies medical problems    Past Surgical History:  Procedure Laterality Date   denies     Social History   Socioeconomic History   Marital status: Married    Spouse name: Not on file   Number of children: Not on file   Years of education: Not on file   Highest education level: Not on file  Occupational History   Not on file  Tobacco Use   Smoking status: Former    Current packs/day: 0.00    Types: Cigarettes    Start date: 27    Quit date: 2014    Years since quitting: 10.7   Smokeless tobacco: Never  Vaping Use   Vaping status: Never Used  Substance and Sexual Activity   Alcohol use: Not Currently    [place]   Drug use: Never   Sexual activity: Not on file  Other Topics Concern   Not on file  Social History Narrative   Not on file   Social Determinants of Health   Financial Resource Strain: Not on file  Food Insecurity: Not on file  Transportation Needs: Not on file  Physical Activity: Not on file  Stress: Not on file  Social Connections: Not on file   No family history on file. No Known Allergies Current Outpatient Medications  Medication Sig Dispense Refill   Blood Glucose Monitoring Suppl DEVI 1 each by Does not apply route in the morning, at noon, and at  bedtime. May substitute to any manufacturer covered by patient's insurance. 1 each 0   Glucose Blood (BLOOD GLUCOSE TEST STRIPS) STRP 1 each by In Vitro route in the morning, at noon, and at bedtime. May substitute to any manufacturer covered by patient's insurance. 100 strip 0   hydrOXYzine (ATARAX) 10 MG tablet Take 1 tablet (10 mg total) by mouth 3 (three) times daily as needed. (Patient not taking: Reported on 10/11/2022) 30 tablet 0   Lancets Misc. MISC 1 each by Does not apply route in the morning, at noon, and at bedtime. May substitute to any manufacturer covered by patient's insurance. 100 each 0   metFORMIN (GLUCOPHAGE) 500 MG tablet Take 1 tablet (500 mg total) by mouth 2 (two) times daily with a meal. 180 tablet 1   No current facility-administered medications for this visit.   No results found.  Review of Systems:   A ROS was performed including pertinent positives and negatives as documented in the HPI.  Physical Exam :   Constitutional: NAD and appears stated age Neurological: Alert and  oriented Psych: Appropriate affect and cooperative There were no vitals taken for this visit.   Comprehensive Musculoskeletal Exam:    Tenderness palpation over the lumbar spine.  Negative straight leg raise.  Positive pain with forward flexion of the spine or any type of twisting.  There is a small nevus about the posterior back which appears benign in nature  Imaging:     I personally reviewed and interpreted the radiographs.   Assessment:   62 y.o. male with lower back pain consistent with degenerative joint disease of the lumbar spine.  At today's visit I have recommended initial trial.  With physical therapy for strengthening of the core and hips.  I will also plan to send him Robaxin as a muscle relaxer to assist with his pain.  I do not believe there is any intervention needed on the nevus on his back.  He will also make a follow-up appointment he does have a trigger finger that he  would like addressed  Plan :    -Physical therapy ordered as well as a prescription for Robaxin     I personally saw and evaluated the patient, and participated in the management and treatment plan.  Huel Cote, MD Attending Physician, Orthopedic Surgery  This document was dictated using Dragon voice recognition software. A reasonable attempt at proof reading has been made to minimize errors.

## 2022-12-21 NOTE — Addendum Note (Signed)
Addended by: Jeanella Cara on: 12/21/2022 02:22 PM   Modules accepted: Orders

## 2023-01-02 ENCOUNTER — Ambulatory Visit: Payer: Medicaid Other | Admitting: Infectious Diseases

## 2023-01-04 ENCOUNTER — Other Ambulatory Visit: Payer: Medicaid Other | Admitting: Pharmacist

## 2023-01-04 NOTE — Progress Notes (Signed)
01/04/2023 Name: Dniel Herzing MRN: [id number] DOB: Nov 09, 1960   Vincent [name] is a 62 y.o. year old male who presented for a telephone visit. Used Education officer, community for [employer].   They were referred to the pharmacist by their Case Management Team  for assistance in managing diabetes and medication access.    Subjective:  Care Team: Primary Care Provider: Ronnald Ramp, MD   Medication Access/Adherence  Current Pharmacy:  [facility] [address] [place] - [address] [place] [address] Phone: [phone] Fax: [phone]   Patient reports affordability concerns with their medications: No  Patient reports access/transportation concerns to their pharmacy: No  Patient reports adherence concerns with their medications:  No    Diabetes:  Current medications: metformin 500mg  BID, new diagnosis of diabetes.   Current glucose readings: 132ing fasting, 158 postprandial Using traditional meter, we facilitated at last visit and he has successfully implemented checking!  Current medication access support: none at present. Patient identifies financial strain to afford $4 copays- requesting help   Objective:  Lab Results  Component Value Date   HGBA1C 9.0 (H) 10/16/2022    Lab Results  Component Value Date   CREATININE 1.05 11/20/2022   BUN 10 11/20/2022   NA 141 11/20/2022   K 5.2 11/20/2022   CL 103 11/20/2022   CO2 21 11/20/2022     Medications Reviewed Today     Reviewed by Gabriel Carina, RPH (Pharmacist) on 01/04/23 at 1647  Med List Status: <None>   Medication Order Taking? Sig Documenting Provider Last Dose Status Informant  Blood Glucose Monitoring Suppl DEVI [id number]  1 each by Does not apply route in the morning, at noon, and at bedtime. May substitute to any manufacturer covered by patient's insurance. Debera Lat, PA-C  Active   hydrOXYzine (ATARAX) 10 MG tablet [id number] No Take 1 tablet (10 mg  total) by mouth 3 (three) times daily as needed.  Patient not taking: Reported on 10/11/2022   Chinita Pester, FNP Not Taking Active   metFORMIN (GLUCOPHAGE) 500 MG tablet [id number] No Take 1 tablet (500 mg total) by mouth 2 (two) times daily with a meal. Ostwalt, Janna, PA-C Taking Active   methocarbamol (ROBAXIN) 500 MG tablet [id number]  Take 1 tablet (500 mg total) by mouth 4 (four) times daily. Huel Cote, MD  Active             Assessment/Plan:   Diabetes: - Currently uncontrolled, metformin initiated, continue current regimen. Patient has obtained glucometer and checking blood sugars successfully. - Recommend increase metformin dose at October PCP visit. - Patient expressed difficulty with $4 copays for medication, offered and explained charge account pharmacy options again at length. Patient declines. Previously connected with Weston Settle for social work support - she connected with patient's spouse for household support financial strain.  - Recommend to check glucose 1-2 times daily as current  Follow Up Plan: none scheduled at this time  Lynnda Shields, PharmD, BCPS Clinical Pharmacist [facility]

## 2023-01-10 ENCOUNTER — Ambulatory Visit (INDEPENDENT_AMBULATORY_CARE_PROVIDER_SITE_OTHER): Payer: Medicaid Other | Admitting: Urology

## 2023-01-10 ENCOUNTER — Encounter: Payer: Self-pay | Admitting: Urology

## 2023-01-10 VITALS — BP 130/74 | HR 80 | Ht 65.0 in | Wt 160.0 lb

## 2023-01-10 DIAGNOSIS — R8271 Bacteriuria: Secondary | ICD-10-CM | POA: Diagnosis not present

## 2023-01-10 DIAGNOSIS — R1030 Lower abdominal pain, unspecified: Secondary | ICD-10-CM | POA: Diagnosis not present

## 2023-01-10 DIAGNOSIS — N390 Urinary tract infection, site not specified: Secondary | ICD-10-CM

## 2023-01-10 LAB — BLADDER SCAN AMB NON-IMAGING: Scan Result: 0

## 2023-01-10 NOTE — Progress Notes (Signed)
I, Maysun Anabel Bene, acting as a scribe for Riki Altes, MD., have documented all relevant documentation on the behalf of Riki Altes, MD, as directed by Riki Altes, MD while in the presence of Riki Altes, MD.  01/10/2023 1:59 PM   Vincent [name] 1960/07/05 [id number]  Referring provider: Debera Lat, PA-C [address] [place],  [place] [address]  Chief Complaint  Patient presents with   Recurrent UTI    HPI: Vincent [name] is a 62 y.o. male referred for UTI and right groin pain.  Has been seen by PCP for chronic abdominal pain and right groin pain. Denies testicular pain. Has had a negative abdominal CT and scrotal ultrasound. CT did show bladder wall thickening, but he has no bothersome lower urinary tract symptoms. Denies dysuria, gross hematuria Denies flank, abdominal, or pelvic pain.  He did have a urine culture in early August which grew multidrug resistant E. coli. The notes indicated he was going to be referred to infectious disease for treatment, though I do not see on chart review where this was treated. He does have chronic low back pain and has seen orthopedic surgery.   PMH: Past Medical History:  Diagnosis Date   Patient denies medical problems     Surgical History: Past Surgical History:  Procedure Laterality Date   denies      Home Medications:  Allergies as of 01/10/2023   No Known Allergies      Medication List        Accurate as of January 10, 2023  1:59 PM. If you have any questions, ask your nurse or doctor.          Blood Glucose Monitoring Suppl Devi 1 each by Does not apply route in the morning, at noon, and at bedtime. May substitute to any manufacturer covered by patient's insurance.   hydrOXYzine 10 MG tablet Commonly known as: ATARAX Take 1 tablet (10 mg total) by mouth 3 (three) times daily as needed.   metFORMIN 500 MG tablet Commonly known as: GLUCOPHAGE Take 1 tablet (500 mg total) by  mouth 2 (two) times daily with a meal.   methocarbamol 500 MG tablet Commonly known as: ROBAXIN Take 1 tablet (500 mg total) by mouth 4 (four) times daily.        Allergies: No Known Allergies   Social History:  reports that he quit smoking about 10 years ago. His smoking use included cigarettes. He started smoking about 45 years ago. He has never used smokeless tobacco. He reports that he does not currently use alcohol. He reports that he does not use drugs.   Physical Exam: BP 130/74   Pulse 80   Ht 5\' 5"  (1.651 m)   Wt 160 lb (72.6 kg)   BMI 26.63 kg/m   Constitutional:  Alert and oriented, No acute distress. HEENT: [place] AT, moist mucus membranes.  Trachea midline, no masses. Cardiovascular: No clubbing, cyanosis, or edema. Respiratory: Normal respiratory effort, no increased work of breathing. GI: Abdomen is soft, nontender, nondistended, no abdominal masses Skin: No rashes, bruises or suspicious lesions. Neurologic: Grossly intact, no focal deficits, moving all 4 extremities. Psychiatric: Normal mood and affect.   Urinalysis Dipstick 1+ protein/trace leukocytes, microscopy negative.   Assessment & Plan:    1. Groin pain Negative CT and scrotal ultrasound.  Pain maybe neuropathic. He is undergoing physical therapy for his low back pain, which may improve these symptoms.  2. Bacteriuria Asymptomatic PVR today 0  mL Although he had bladder wall thickening on CT, this is most likely secondary to BPH. Since he is asymptomatic and emptying completely, would not recommend any treatment.  Follow up PRN.  [facility] [address] [place], [place] [address] [phone]

## 2023-01-11 ENCOUNTER — Ambulatory Visit: Payer: Medicaid Other | Attending: Infectious Diseases | Admitting: Infectious Diseases

## 2023-01-11 ENCOUNTER — Other Ambulatory Visit
Admission: RE | Admit: 2023-01-11 | Discharge: 2023-01-11 | Disposition: A | Discharge status: Home / Self Care | Payer: Medicaid Other | Source: Ambulatory Visit | Attending: Infectious Diseases | Admitting: Infectious Diseases

## 2023-01-11 ENCOUNTER — Encounter: Payer: Self-pay | Admitting: Infectious Diseases

## 2023-01-11 ENCOUNTER — Encounter: Payer: Self-pay | Admitting: Urology

## 2023-01-11 VITALS — BP 122/86 | HR 76 | Temp 97.9°F | Ht 67.0 in | Wt 149.0 lb

## 2023-01-11 DIAGNOSIS — R8271 Bacteriuria: Secondary | ICD-10-CM | POA: Diagnosis not present

## 2023-01-11 DIAGNOSIS — Z8619 Personal history of other infectious and parasitic diseases: Secondary | ICD-10-CM

## 2023-01-11 DIAGNOSIS — R1031 Right lower quadrant pain: Secondary | ICD-10-CM | POA: Insufficient documentation

## 2023-01-11 DIAGNOSIS — E119 Type 2 diabetes mellitus without complications: Secondary | ICD-10-CM | POA: Insufficient documentation

## 2023-01-11 DIAGNOSIS — Z7984 Long term (current) use of oral hypoglycemic drugs: Secondary | ICD-10-CM | POA: Diagnosis not present

## 2023-01-11 DIAGNOSIS — Z8615 Personal history of latent tuberculosis infection: Secondary | ICD-10-CM | POA: Insufficient documentation

## 2023-01-11 LAB — URINALYSIS, ROUTINE W REFLEX MICROSCOPIC
Bacteria, UA: NONE SEEN
Bilirubin Urine: NEGATIVE
Glucose, UA: 150 mg/dL — AB
Hgb urine dipstick: NEGATIVE
Ketones, ur: NEGATIVE mg/dL
Leukocytes,Ua: NEGATIVE
Nitrite: NEGATIVE
Protein, ur: 30 mg/dL — AB
Specific Gravity, Urine: 1.02 (ref 1.005–1.030)
pH: 5 (ref 5.0–8.0)

## 2023-01-11 LAB — URINALYSIS, COMPLETE
Bilirubin, UA: NEGATIVE
Glucose, UA: NEGATIVE
Ketones, UA: NEGATIVE
Nitrite, UA: NEGATIVE
RBC, UA: NEGATIVE
Specific Gravity, UA: 1.025 (ref 1.005–1.030)
Urobilinogen, Ur: 0.2 mg/dL (ref 0.2–1.0)
pH, UA: 6 (ref 5.0–7.5)

## 2023-01-11 LAB — HEPATITIS C ANTIBODY: HCV Ab: NONREACTIVE

## 2023-01-11 LAB — MICROSCOPIC EXAMINATION

## 2023-01-11 LAB — HEPATITIS B CORE ANTIBODY, TOTAL: Hep B Core Total Ab: REACTIVE — AB

## 2023-01-11 LAB — HEPATITIS A ANTIBODY, TOTAL: hep A Total Ab: REACTIVE — AB

## 2023-01-11 LAB — AMYLASE: Amylase: 99 U/L (ref 28–100)

## 2023-01-11 LAB — HIV ANTIBODY (ROUTINE TESTING W REFLEX): HIV Screen 4th Generation wRfx: NONREACTIVE

## 2023-01-11 LAB — HEPATITIS B SURFACE ANTIGEN: Hepatitis B Surface Ag: NONREACTIVE

## 2023-01-11 NOTE — Patient Instructions (Signed)
You are here for a positive urine culture- as you have no urinary symptoms and urine analysis yesterday was normal you dont need any antibiotics- will do some tests to check hepc, HIV routine Will let your PCP know

## 2023-01-11 NOTE — Progress Notes (Signed)
NAME: Vincent [name]  DOB: 10/12/1960  MRN: [id number]  Date/Time: 01/11/2023 10:48 AM   Subjective:   Interpreter in the room Vincent [name] is a 62 y.o. male from [facility] is in the [place] for 1-1/2  years with a history of diabetes on metformin, positive quant gold and taken treatment for 4 months with rifampin with health Dept in 2023 ( notes in media) Is here for abonrmal urine culture in Aug ESBl ecoli- pt missied multiple appts . Today his main c/o rt groin and testiculr pain while standing He has no urinary symptoms No burning, no difficulty passing urine , no blood No incontinence CT abdomen and pelvis- N except urinary bladder wall thickeneing- UA done by Dr.Stoioff yesterday N Post void bladder scan zero  CT chest N CBC/CMP N  His main complaint is rt groin and rt lower quad pain while standing PMH Latent TB diagnosed in [place] and took rifampin X 4  Malaria X 2 in [facility]  Past Surgical History:  Procedure Laterality Date   denies      Social History   Socioeconomic History   Marital status: Married    Spouse name: Not on file   Number of children: Not on file   Years of education: Not on file   Highest education level: Not on file  Occupational History   Not on file  Tobacco Use   Smoking status: Former    Current packs/day: 0.00    Types: Cigarettes    Start date: 1    Quit date: 2014    Years since quitting: 10.7   Smokeless tobacco: Never  Vaping Use   Vaping status: Never Used  Substance and Sexual Activity   Alcohol use: Not Currently    Comment[place]   Drug use: Never   Sexual activity: Not on file  Other Topics Concern   Not on file  Social History Narrative   Not on file   Social Determinants of Health   Financial Resource Strain: Not on file  Food Insecurity: Not on file  Transportation Needs: Not on file  Physical Activity: Not on file  Stress: Not on file  Social Connections: Not on file  Intimate  Partner Violence: Not on file    No family history on file. No Known Allergies I? Current Outpatient Medications  Medication Sig Dispense Refill   ACCU-CHEK GUIDE test strip USE ONE STRIP IN THE MORNING, ONE AT NOON, AND ONE AT BEDTIME     Accu-Chek Softclix Lancets lancets SMARTSIG:Topical     Blood Glucose Monitoring Suppl DEVI 1 each by Does not apply route in the morning, at noon, and at bedtime. May substitute to any manufacturer covered by patient's insurance. 1 each 0   hydrOXYzine (ATARAX) 10 MG tablet Take 1 tablet (10 mg total) by mouth 3 (three) times daily as needed. 30 tablet 0   metFORMIN (GLUCOPHAGE) 500 MG tablet Take 1 tablet (500 mg total) by mouth 2 (two) times daily with a meal. 180 tablet 1   methocarbamol (ROBAXIN) 500 MG tablet Take 1 tablet (500 mg total) by mouth 4 (four) times daily. 30 tablet 3   No current facility-administered medications for this visit.     Abtx:  Anti-infectives (From admission, onward)    None       REVIEW OF SYSTEMS:  Const: negative fever, negative chills, weight gain Eyes: negative diplopia or visual changes, negative eye pain ENT: negative coryza, negative sore  throat Resp: negative cough, hemoptysis, dyspnea Cards: negative for chest pain, palpitations, lower extremity edema GU: negative for frequency, dysuria and hematuria GI: rt groin pain  Skin: negative for rash and pruritus Heme: negative for easy bruising and gum/nose bleeding MS: negative for myalgias, arthralgias, back pain and muscle weakness Neurolo:negative for headaches, dizziness, vertigo, memory problems  Psych: negative for feelings of anxiety, depression  Endocrine:  diabetes Allergy/Immunology- negative for any medication or food allergies  Objective:  VITALS:  BP 122/86   Pulse 76   Temp 97.9 F (36.6 C) (Temporal)   Ht 5\' 7"  (1.702 m)   Wt 149 lb (67.6 kg)   BMI 23.34 kg/m   PHYSICAL EXAM:  General: Alert, cooperative, no distress, appears  stated age.  Head: Normocephalic, without obvious abnormality, atraumatic. Eyes: Conjunctivae clear, anicteric sclerae. Pupils are equal ENT Nares normal. No drainage or sinus tenderness. Lips, mucosa, and tongue normal. No Thrush Neck: Supple, symmetrical, no adenopathy, thyroid: non tender no carotid bruit and no JVD. Back: No CVA tenderness. Lungs: Clear to auscultation bilaterally. No Wheezing or Rhonchi. No rales. Heart: Regular rate and rhythm, no murmur, rub or gallop. Abdomen: Soft, non-tender,not distended. Bowel sounds normal. No masses Extremities: atraumatic, no cyanosis. No edema. No clubbing Skin: No rashes or lesions. Or bruising Lymph: Cervical, supraclavicular normal. Neurologic: Grossly non-focal Pertinent Labs Lab Results CBC    Component Value Date/Time   WBC 5.5 10/16/2022 1613   WBC 5.3 03/11/2022 1515   RBC 4.85 10/16/2022 1613   RBC 5.00 03/11/2022 1515   HGB 14.2 10/16/2022 1613   HCT 42.4 10/16/2022 1613   PLT 221 10/16/2022 1613   MCV 87 10/16/2022 1613   MCH 29.3 10/16/2022 1613   MCH 29.0 03/11/2022 1515   MCHC 33.5 10/16/2022 1613   MCHC 32.6 03/11/2022 1515   RDW 11.4 (L) 10/16/2022 1613   LYMPHSABS 2.6 10/16/2022 1613   MONOABS 0.4 03/11/2022 1515   EOSABS 0.2 10/16/2022 1613   BASOSABS 0.0 10/16/2022 1613       Latest Ref Rng & Units 11/20/2022    3:28 PM 10/16/2022    4:13 PM 03/11/2022    3:15 PM  CMP  Glucose 70 - 99 mg/dL 161  096  045   BUN 8 - 27 mg/dL 10  16  16    Creatinine 0.76 - 1.27 mg/dL 4.09  8.11  9.14   Sodium 134 - 144 mmol/L 141  137  137   Potassium 3.5 - 5.2 mmol/L 5.2  4.4  4.6   Chloride 96 - 106 mmol/L 103  99  105   CO2 20 - 29 mmol/L 21  23  27    Calcium 8.6 - 10.2 mg/dL 9.9  9.7  9.3   Total Protein 6.0 - 8.5 g/dL  7.9    Total Bilirubin 0.0 - 1.2 mg/dL  0.9    Alkaline Phos 44 - 121 IU/L  100    AST 0 - 40 IU/L  17    ALT 0 - 44 IU/L  18        Microbiology: Recent Results (from the past 240 hour(s))   Microscopic Examination     Status: Abnormal   Collection Time: 01/10/23  1:28 PM   Urine  Result Value Ref Range Status   WBC, UA 0-5 0 - 5 /hpf Final   RBC, Urine 0-2 0 - 2 /hpf Final   Epithelial Cells (non renal) 0-10 0 - 10 /hpf Final   Mucus, UA  Present (A) Not Estab. Final   Bacteria, UA Few None seen/Few Final    IMAGING RESULTS: CT abd reviewed Normal except bladder wall thickening I have personally reviewed the films ? Impression/Recommendation ?Pt was referred to me in Aug for ESBL ecoli in urine culture- He has no urinary symptoms and US done yesterday is normal This is asymtpomatic bacteriuria and does not need treatment The groin pan on the rt is not related to this  Rt groin pain- investigated failry thoroughly with no cause R/o radiating pain from lumbar pathology Consider colonoscopy to complete the work up  Urlogy saw patient and no pathology identified  Today checking hepatitis panel and HIV  ?Treated latent TB with rifampin X 4 months by [facility] in Aug 2023 ( records in media) ? _Discussed with patient in detail Note:  This document was prepared using Conservation officer, historic buildings and may include unintentional dictation errors.

## 2023-01-12 LAB — HEPATITIS B SURFACE ANTIBODY, QUANTITATIVE: Hep B S AB Quant (Post): 44.6 m[IU]/mL

## 2023-01-15 NOTE — Progress Notes (Unsigned)
      Established patient visit   Patient: Vincent [name]   DOB: June 21, 1960   62 y.o. Male  MRN: [id number] Visit Date: 01/16/2023  Today's healthcare provider: Ronnald Ramp, MD   No chief complaint on file.  Subjective       Discussed the use of AI scribe software for clinical note transcription with the patient, who gave verbal consent to proceed.  History of Present Illness             Past Medical History:  Diagnosis Date   Patient denies medical problems     Medications: Outpatient Medications Prior to Visit  Medication Sig   ACCU-CHEK GUIDE test strip USE ONE STRIP IN THE MORNING, ONE AT NOON, AND ONE AT BEDTIME   Accu-Chek Softclix Lancets lancets SMARTSIG:Topical   Blood Glucose Monitoring Suppl DEVI 1 each by Does not apply route in the morning, at noon, and at bedtime. May substitute to any manufacturer covered by patient's insurance.   hydrOXYzine (ATARAX) 10 MG tablet Take 1 tablet (10 mg total) by mouth 3 (three) times daily as needed.   metFORMIN (GLUCOPHAGE) 500 MG tablet Take 1 tablet (500 mg total) by mouth 2 (two) times daily with a meal.   methocarbamol (ROBAXIN) 500 MG tablet Take 1 tablet (500 mg total) by mouth 4 (four) times daily.   No facility-administered medications prior to visit.    Review of Systems  {Insert previous labs (optional):23779} {See past labs  Heme  Chem  Endocrine  Serology  Results Review (optional):1}   Objective    There were no vitals taken for this visit. {Insert last BP/Wt (optional):23777}{See vitals history (optional):1}   Physical Exam  ***  No results found for any visits on 01/16/23.  Assessment & Plan     Problem List Items Addressed This Visit   None   Assessment and Plan              No follow-ups on file.         Ronnald Ramp, MD  [facility] [phone] (phone) [phone] (fax)  [facility]

## 2023-01-16 ENCOUNTER — Ambulatory Visit: Payer: Medicaid Other | Admitting: Physician Assistant

## 2023-01-16 ENCOUNTER — Encounter: Payer: Self-pay | Admitting: Family Medicine

## 2023-01-16 ENCOUNTER — Ambulatory Visit (INDEPENDENT_AMBULATORY_CARE_PROVIDER_SITE_OTHER): Payer: Medicaid Other | Admitting: Family Medicine

## 2023-01-16 VITALS — BP 117/85 | HR 90 | Ht 67.0 in | Wt 148.7 lb

## 2023-01-16 DIAGNOSIS — Z758 Other problems related to medical facilities and other health care: Secondary | ICD-10-CM

## 2023-01-16 DIAGNOSIS — Z7984 Long term (current) use of oral hypoglycemic drugs: Secondary | ICD-10-CM | POA: Diagnosis not present

## 2023-01-16 DIAGNOSIS — R768 Other specified abnormal immunological findings in serum: Secondary | ICD-10-CM | POA: Diagnosis not present

## 2023-01-16 DIAGNOSIS — E119 Type 2 diabetes mellitus without complications: Secondary | ICD-10-CM | POA: Diagnosis not present

## 2023-01-16 DIAGNOSIS — M545 Low back pain, unspecified: Secondary | ICD-10-CM | POA: Diagnosis not present

## 2023-01-16 DIAGNOSIS — R109 Unspecified abdominal pain: Secondary | ICD-10-CM

## 2023-01-16 DIAGNOSIS — G8929 Other chronic pain: Secondary | ICD-10-CM

## 2023-01-16 HISTORY — DX: Other specified abnormal immunological findings in serum: R76.8

## 2023-01-16 MED ORDER — GABAPENTIN 100 MG PO CAPS
100.0000 mg | ORAL_CAPSULE | Freq: Every day | ORAL | 3 refills | Status: DC
Start: 2023-01-16 — End: 2023-06-14

## 2023-01-17 LAB — CMP14+EGFR
ALT: 28 [IU]/L (ref 0–44)
AST: 21 [IU]/L (ref 0–40)
Albumin: 4.5 g/dL (ref 3.9–4.9)
Alkaline Phosphatase: 120 [IU]/L (ref 44–121)
BUN/Creatinine Ratio: 8 — ABNORMAL LOW (ref 10–24)
BUN: 9 mg/dL (ref 8–27)
Bilirubin Total: 0.6 mg/dL (ref 0.0–1.2)
CO2: 23 mmol/L (ref 20–29)
Calcium: 9.9 mg/dL (ref 8.6–10.2)
Chloride: 102 mmol/L (ref 96–106)
Creatinine, Ser: 1.14 mg/dL (ref 0.76–1.27)
Globulin, Total: 3.6 g/dL (ref 1.5–4.5)
Glucose: 189 mg/dL — ABNORMAL HIGH (ref 70–99)
Potassium: 4.6 mmol/L (ref 3.5–5.2)
Sodium: 141 mmol/L (ref 134–144)
Total Protein: 8.1 g/dL (ref 6.0–8.5)
eGFR: 73 mL/min/{1.73_m2} (ref >59)

## 2023-01-17 LAB — ACUTE HEP PANEL AND HEP B SURFACE AB
Hep A IgM: NEGATIVE
Hep B C IgM: NEGATIVE
Hep C Virus Ab: NONREACTIVE
Hepatitis B Surf Ab Quant: 47.9 m[IU]/mL
Hepatitis B Surface Ag: NEGATIVE

## 2023-01-17 LAB — HEMOGLOBIN A1C
Est. average glucose Bld gHb Est-mCnc: 171 mg/dL
Hgb A1c MFr Bld: 7.6 % — ABNORMAL HIGH (ref 4.8–5.6)

## 2023-01-17 NOTE — Assessment & Plan Note (Signed)
Pain has been present for over a year, exacerbated by eating and deep breathing. No clear etiology identified on previous imaging. Tenderness noted in right upper quadrant on examination. -Order blood tests to further investigate cause of pain. -Start Gabapentin 100mg  at bedtime for pain management.

## 2023-01-17 NOTE — Assessment & Plan Note (Signed)
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 -Confirm hepatitis test with additional blood work.ordered acute hepatitis panel  -Plan for antiviral therapy pending confirmation of hepatitis

## 2023-01-17 NOTE — Assessment & Plan Note (Signed)
Back pain, chronic Onset three months ago after hearing a "click" in his back. Pain occurs after sitting or walking for more than 40 minutes. -Continue Gabapentin 100mg  at bedtime for pain management.

## 2023-01-18 ENCOUNTER — Telehealth: Payer: Self-pay

## 2023-01-18 ENCOUNTER — Ambulatory Visit: Payer: Medicaid Other | Admitting: Dietician

## 2023-01-18 ENCOUNTER — Ambulatory Visit: Payer: Medicaid Other

## 2023-01-18 NOTE — Telephone Encounter (Signed)
-----   Message from [facility] Simmons[facility]Robinson sent at 01/17/2023  7:35 AM EDT ----- #Pt requires interpreter for results#   A1c is improved from 9 to 7.6, goal for diabetes control is lower than 7.   Kidney, liver function within normal limits.  Electrolytes were within normal range  Follow up testing for hepatitis was negative for hepatitis A,B, and C.

## 2023-01-18 NOTE — Telephone Encounter (Signed)
Attempted to call patient with interpreter on the line. No Answer, Mailbox full

## 2023-01-26 ENCOUNTER — Telehealth: Payer: Self-pay | Admitting: Family Medicine

## 2023-01-26 NOTE — Telephone Encounter (Signed)
Interpreter ID # [id number] [place] Patient advised. Appt scheduled

## 2023-01-26 NOTE — Telephone Encounter (Signed)
I have reviewed patient's concerns regarding back pain, urology notes, prior imaging and hepatitis testing.  We can discuss these concerns further during an office visit. Please offer patient earlier appt than 02/13/23 to discuss

## 2023-01-26 NOTE — Telephone Encounter (Signed)
-----   Message from CMA Joseline R sent at 01/24/2023  4:24 PM EDT ----- Patient reports that still having back pain, worsening. Gabapentin is not helping. He has been to the Urologist.  Patient reports he was told some issues with bladder but no infection. Patient requesting for Dr. Roxan Hockey to contact the Urologist about it. Patient wants to make sure nothing is wrong with his kidneys, and all the areas that he has made Dr. Roxan Hockey aware that he hurts. Also wants to know how is his liver doing. That he was tested for hepatitis. Advised patient of hepatitis screening results again and patient reports that he was seen at this other place and he was told he had a liver infection? ----- Message ----- From: Rolly Salter, CMA Sent: 01/18/2023   4:50 PM EDT To: Ronnald Ramp, MD; #  Attempted to contact pt with interpreter, was unsuccessful. Will try again 01/19/23 ----- Message ----- From: Ronnald Ramp, MD Sent: 01/17/2023   7:35 AM EDT To: Simmons[facility] Nurse  #Pt requires interpreter for results#   A1c is improved from 9 to 7.6, goal for diabetes control is lower than 7.   Kidney, liver function within normal limits.  Electrolytes were within normal range  Follow up testing for hepatitis was negative for hepatitis A,B, and C.

## 2023-01-26 NOTE — Telephone Encounter (Signed)
Interpreter service request entered for phone call within 30 minutes

## 2023-01-27 NOTE — Progress Notes (Deleted)
Established patient visit   Patient: Vincent [name]   DOB: 1961/02/11   62 y.o. Male  MRN: [id number] Visit Date: 01/29/2023  Today's healthcare provider: Ronnald Ramp, MD   No chief complaint on file.  Subjective       Discussed the use of AI scribe software for clinical note transcription with the patient, who gave verbal consent to proceed.  History of Present Illness             Past Medical History:  Diagnosis Date   Patient denies medical problems     Medications: Outpatient Medications Prior to Visit  Medication Sig   ACCU-CHEK GUIDE test strip USE ONE STRIP IN THE MORNING, ONE AT NOON, AND ONE AT BEDTIME   Accu-Chek Softclix Lancets lancets SMARTSIG:Topical   Blood Glucose Monitoring Suppl (ACCU-CHEK GUIDE) w/Device KIT See admin instructions.   Blood Glucose Monitoring Suppl DEVI 1 each by Does not apply route in the morning, at noon, and at bedtime. May substitute to any manufacturer covered by patient's insurance.   gabapentin (NEURONTIN) 100 MG capsule Take 1 capsule (100 mg total) by mouth at bedtime.   hydrOXYzine (ATARAX) 10 MG tablet Take 1 tablet (10 mg total) by mouth 3 (three) times daily as needed. (Patient not taking: Reported on 01/16/2023)   metFORMIN (GLUCOPHAGE) 500 MG tablet Take 1 tablet (500 mg total) by mouth 2 (two) times daily with a meal.   methocarbamol (ROBAXIN) 500 MG tablet Take 1 tablet (500 mg total) by mouth 4 (four) times daily.   No facility-administered medications prior to visit.    Review of Systems  Last metabolic panel Lab Results  Component Value Date   GLUCOSE 189 (H) 01/16/2023   NA 141 01/16/2023   K 4.6 01/16/2023   CL 102 01/16/2023   CO2 23 01/16/2023   BUN 9 01/16/2023   CREATININE 1.14 01/16/2023   EGFR 73 01/16/2023   CALCIUM 9.9 01/16/2023   PROT 8.1 01/16/2023   ALBUMIN 4.5 01/16/2023   LABGLOB 3.6 01/16/2023   BILITOT 0.6 01/16/2023   ALKPHOS 120 01/16/2023   AST 21 01/16/2023    ALT 28 01/16/2023   ANIONGAP 5 03/11/2022    Last hemoglobin A1c Lab Results  Component Value Date   HGBA1C 7.6 (H) 01/16/2023   Last thyroid functions Lab Results  Component Value Date   TSH 3.160 10/16/2022      Latest Ref Rng & Units 01/16/2023    4:36 PM  Hepatitis  Hep B Surface Ag Negative Negative   Hep B IgM Negative Negative   Hep A IgM Negative Negative    CT ABDOMEN PELVIS WO CONTRAST CLINICAL DATA:  Chronic abdominal pain.  EXAM: CT ABDOMEN AND PELVIS WITHOUT CONTRAST  TECHNIQUE: Multidetector CT imaging of the abdomen and pelvis was performed following the standard protocol without IV contrast.  RADIATION DOSE REDUCTION: This exam was performed according to the departmental dose-optimization program which includes automated exposure control, adjustment of the mA and/or kV according to patient size and/or use of iterative reconstruction technique.  COMPARISON:  None Available.  FINDINGS: Lower chest: Lung bases clear.  No pleural or pericardial effusion.  Hepatobiliary: No focal liver abnormality is seen. No gallstones, gallbladder wall thickening, or biliary dilatation.  Pancreas: Unremarkable. No pancreatic ductal dilatation or surrounding inflammatory changes.  Spleen: Normal in size without focal abnormality.  Adrenals/Urinary Tract: Adrenal glands are unremarkable. Kidneys are normal, without renal calculi, focal lesion, or hydronephrosis. Urinary bladder wall thickening  noted consistent with hypertrophy or inflammation.  Stomach/Bowel: Stomach is within normal limits. Appendix appears normal. No evidence of bowel wall thickening, distention, or inflammatory changes.  Vascular/Lymphatic: No significant vascular findings are present. No enlarged abdominal or pelvic lymph nodes.  Reproductive: Prostate is unremarkable.  Other: No abdominal wall hernia or abnormality. No abdominopelvic ascites.  Musculoskeletal: No acute or  significant osseous findings.  IMPRESSION: Urinary bladder wall thickening consistent with hypertrophy or inflammation. Otherwise no acute abdominal or pelvic pathology.  Electronically Signed   By: Layla Maw M.D.   On: 12/08/2022 16:04     {See past labs  Heme  Chem  Endocrine  Serology  Results Review (optional):1}   Objective    There were no vitals taken for this visit. {Insert last BP/Wt (optional):23777}{See vitals history (optional):1}   Physical Exam  ***  No results found for any visits on 01/29/23.  Assessment & Plan     Problem List Items Addressed This Visit     Chronic bilateral low back pain   Groin pain, right - Primary   Hepatitis B core antibody positive   Other constipation   Swahili language interpreter needed    Assessment and Plan              No follow-ups on file.         Ronnald Ramp, MD  [facility] [phone] (phone) [phone] (fax)  [facility]

## 2023-01-29 ENCOUNTER — Ambulatory Visit: Payer: Medicaid Other | Admitting: Family Medicine

## 2023-01-29 DIAGNOSIS — R1031 Right lower quadrant pain: Secondary | ICD-10-CM

## 2023-01-29 DIAGNOSIS — R768 Other specified abnormal immunological findings in serum: Secondary | ICD-10-CM

## 2023-01-29 DIAGNOSIS — Z758 Other problems related to medical facilities and other health care: Secondary | ICD-10-CM

## 2023-01-29 DIAGNOSIS — M545 Low back pain, unspecified: Secondary | ICD-10-CM

## 2023-01-29 DIAGNOSIS — K5909 Other constipation: Secondary | ICD-10-CM

## 2023-02-13 ENCOUNTER — Encounter: Payer: Self-pay | Admitting: Family Medicine

## 2023-02-13 ENCOUNTER — Ambulatory Visit (INDEPENDENT_AMBULATORY_CARE_PROVIDER_SITE_OTHER): Payer: Medicaid Other | Admitting: Family Medicine

## 2023-02-13 VITALS — BP 112/78 | HR 72 | Ht 67.0 in | Wt 151.0 lb

## 2023-02-13 DIAGNOSIS — G8929 Other chronic pain: Secondary | ICD-10-CM

## 2023-02-13 DIAGNOSIS — R1011 Right upper quadrant pain: Secondary | ICD-10-CM

## 2023-02-13 DIAGNOSIS — R6881 Early satiety: Secondary | ICD-10-CM

## 2023-02-13 DIAGNOSIS — R109 Unspecified abdominal pain: Secondary | ICD-10-CM | POA: Diagnosis not present

## 2023-02-13 DIAGNOSIS — Z758 Other problems related to medical facilities and other health care: Secondary | ICD-10-CM

## 2023-02-13 DIAGNOSIS — M545 Low back pain, unspecified: Secondary | ICD-10-CM

## 2023-02-13 DIAGNOSIS — R160 Hepatomegaly, not elsewhere classified: Secondary | ICD-10-CM

## 2023-02-13 DIAGNOSIS — E119 Type 2 diabetes mellitus without complications: Secondary | ICD-10-CM

## 2023-02-13 DIAGNOSIS — Z7984 Long term (current) use of oral hypoglycemic drugs: Secondary | ICD-10-CM

## 2023-02-13 DIAGNOSIS — R1031 Right lower quadrant pain: Secondary | ICD-10-CM

## 2023-02-13 MED ORDER — ACCU-CHEK GUIDE VI STRP: ORAL_STRIP | 6 refills | Status: DC

## 2023-02-13 MED ORDER — ACCU-CHEK SOFTCLIX LANCETS MISC: 6 refills | Status: DC

## 2023-02-13 NOTE — Assessment & Plan Note (Signed)
Patient reports one testicle feels larger than the other, but physical examination did not reveal significant asymmetry or signs of inflammation. -Advise patient to monitor symptoms and report any changes. -ordered repeat CT of abdomen and pelvic Patient had normal urology evaluation, noted to have urine culture findings of e coli resistant to oral abx, ID evaluated pt and did not recommend treatment for asymptomatic bacteruria

## 2023-02-13 NOTE — Assessment & Plan Note (Signed)
Right-sided abdominal pain Chronic pain radiating to back and testicles, with no clear etiology identified despite multiple specialist consultations. Possible inguinal hernia noted on physical examination. -Order repeat CT scan of abdomen and pelvis. -Refer to gastroenterology for further evaluation. -Schedule H. Pylori breath test.

## 2023-02-13 NOTE — Assessment & Plan Note (Signed)
In person swahili interpreter was present for entire interview and examination of Vincent [name]

## 2023-02-13 NOTE — Assessment & Plan Note (Addendum)
Chronic  Refilled testing supplies  Continue metformin 500mg  twice daily

## 2023-02-13 NOTE — Patient Instructions (Signed)
VISIT SUMMARY:  Today, we discussed your ongoing right-sided abdominal pain, which has been persistent for the past eight months and radiates to your back and testicles. Despite consultations with various specialists and multiple tests, the cause of your pain remains undiagnosed. We also reviewed your urinary tract infection caused by resistant bacteria and your testicular discomfort.  YOUR PLAN:  -RIGHT-SIDED ABDOMINAL PAIN: You have been experiencing chronic pain on your right side that radiates to your back and testicles. This pain has been persistent for eight months, and despite multiple consultations, the cause is still unknown. We will order a repeat CT scan of your abdomen and pelvis, refer you to a gastroenterologist for further evaluation, and schedule an H. Pylori breath test to investigate further.  -URINARY TRACT INFECTION: You have a urinary tract infection caused by bacteria that are resistant to oral antibiotics. This means the bacteria cannot be treated with standard oral medications. We will continue to monitor your symptoms and manage them conservatively for now.  -TESTICULAR DISCOMFORT: You have reported that one of your testicles feels larger than the other and causes you pain. However, the physical examination did not show significant differences or signs of inflammation. Please continue to monitor your symptoms and report any changes.  INSTRUCTIONS:  Please follow up in one month to discuss the results of your tests and further management.

## 2023-02-13 NOTE — Progress Notes (Signed)
Established patient visit   Patient: Vincent [name]   DOB: 07-16-1960   62 y.o. Male  MRN: [id number] Visit Date: 02/13/2023  Today's healthcare provider: Ronnald Ramp, MD   Chief Complaint  Patient presents with  . Abdominal Pain    Pt stated-right side of the adb radiated to the back and testicle--8 months   Subjective     HPI     Abdominal Pain    Additional comments: Pt stated-right side of the adb radiated to the back and testicle--8 months      Last edited by Shelly Bombard, CMA on 02/13/2023  2:49 PM.       Discussed the use of AI scribe software for clinical note transcription with the patient, who gave verbal consent to proceed.  History of Present Illness   The patient is a 62 year old male who has been experiencing right-sided abdominal pain for the past eight months. The pain radiates to his back and testicles. Despite consultations with urology, infectious disease, and orthopedics specialists, there has been no improvement in his condition. The patient has undergone testing for hepatitis B and A, both of which returned negative results.  The patient's pain is persistent and is present even during the consultation. He describes the pain as being located on his right side, affecting his abdomen, groin, and right testicle. Imaging conducted in August revealed some thickening of the bladder wall, but no other masses or abnormalities that could account for his symptoms. The patient has noticed that one testicle feels larger than the other when he touches it, and this causes him pain.  The patient's urine has shown the presence of a bacteria that is resistant to oral antibiotics, requiring IV treatment. However, the patient reports that he has not received this treatment despite it being four months since it was recommended by the infectious disease specialist.  The patient also reports that his right testicle had previously swollen while he was in  [place], causing significant pain for two weeks before gradually decreasing in size. He is concerned that this issue may be recurring. On examination, the patient's liver is slightly enlarged and tender. The patient also reports that he has been experiencing difficulty eating large portions of food due to the pain it causes.  The patient's condition remains undiagnosed despite multiple consultations and tests. The patient is understandably concerned and eager to determine the cause of his symptoms.         Past Medical History:  Diagnosis Date  . Patient denies medical problems     Medications: Outpatient Medications Prior to Visit  Medication Sig  . gabapentin (NEURONTIN) 100 MG capsule Take 1 capsule (100 mg total) by mouth at bedtime.  . metFORMIN (GLUCOPHAGE) 500 MG tablet Take 1 tablet (500 mg total) by mouth 2 (two) times daily with a meal.  . Blood Glucose Monitoring Suppl (ACCU-CHEK GUIDE) w/Device KIT See admin instructions. (Patient not taking: Reported on 02/13/2023)  . Blood Glucose Monitoring Suppl DEVI 1 each by Does not apply route in the morning, at noon, and at bedtime. May substitute to any manufacturer covered by patient's insurance. (Patient not taking: Reported on 02/13/2023)  . hydrOXYzine (ATARAX) 10 MG tablet Take 1 tablet (10 mg total) by mouth 3 (three) times daily as needed. (Patient not taking: Reported on 02/13/2023)  . methocarbamol (ROBAXIN) 500 MG tablet Take 1 tablet (500 mg total) by mouth 4 (four) times daily. (Patient not taking: Reported on 02/13/2023)  . [DISCONTINUED]  ACCU-CHEK GUIDE test strip USE ONE STRIP IN THE MORNING, ONE AT NOON, AND ONE AT BEDTIME (Patient not taking: Reported on 02/13/2023)  . [DISCONTINUED] Accu-Chek Softclix Lancets lancets SMARTSIG:Topical (Patient not taking: Reported on 02/13/2023)   No facility-administered medications prior to visit.    Review of Systems  Last CBC Lab Results  Component Value Date   WBC 5.5 10/16/2022    HGB 14.2 10/16/2022   HCT 42.4 10/16/2022   MCV 87 10/16/2022   MCH 29.3 10/16/2022   RDW 11.4 (L) 10/16/2022   PLT 221 10/16/2022   Last metabolic panel Lab Results  Component Value Date   GLUCOSE 189 (H) 01/16/2023   NA 141 01/16/2023   K 4.6 01/16/2023   CL 102 01/16/2023   CO2 23 01/16/2023   BUN 9 01/16/2023   CREATININE 1.14 01/16/2023   EGFR 73 01/16/2023   CALCIUM 9.9 01/16/2023   PROT 8.1 01/16/2023   ALBUMIN 4.5 01/16/2023   LABGLOB 3.6 01/16/2023   BILITOT 0.6 01/16/2023   ALKPHOS 120 01/16/2023   AST 21 01/16/2023   ALT 28 01/16/2023   ANIONGAP 5 03/11/2022    Last hemoglobin A1c Lab Results  Component Value Date   HGBA1C 7.6 (H) 01/16/2023   Last thyroid functions Lab Results  Component Value Date   TSH 3.160 10/16/2022   CT: 11/2022  IMPRESSION: Urinary bladder wall thickening consistent with hypertrophy or inflammation. Otherwise no acute abdominal or pelvic pathology.   Normal testicular US 10/2022  Results for orders placed or performed in visit on 01/10/23  Microscopic Examination     Status: Abnormal   Collection Time: 01/10/23  1:28 PM   Urine  Result Value Ref Range Status   WBC, UA 0-5 0 - 5 /hpf Final   RBC, Urine 0-2 0 - 2 /hpf Final   Epithelial Cells (non renal) 0-10 0 - 10 /hpf Final   Mucus, UA Present (A) Not Estab. Final   Bacteria, UA Few None seen/Few Final   Last urine culture from 11/20/22 showed e coli :  Amoxicillin/Clavulanic Acid    R  Ampicillin                     R  Cefazolin                      R  Cefepime                       R  Ceftriaxone                    R  Cefuroxime                     R  Ciprofloxacin                  R  Ertapenem                      S  Gentamicin                     S  Imipenem                       S  Levofloxacin                   R  Meropenem  S  Nitrofurantoin                 R  Piperacillin/Tazobactam        S  Tetracycline                   R   Tobramycin                     R  Trimethoprim/Sulfa             R       Objective    BP 112/78 (BP Location: Right Arm, Patient Position: Sitting, Cuff Size: Normal)   Pulse 72   Ht 5\' 7"  (1.702 m)   Wt 151 lb (68.5 kg)   SpO2 97%   BMI 23.65 kg/m  BP Readings from Last 3 Encounters:  02/13/23 112/78  01/16/23 117/85  01/11/23 122/86   Wt Readings from Last 3 Encounters:  02/13/23 151 lb (68.5 kg)  01/16/23 148 lb 11.2 oz (67.4 kg)  01/11/23 149 lb (67.6 kg)          Physical Exam  Physical Exam   CHEST: Bilateral rales. CARDIOVASCULAR: Irregular rate and rhythm. ABDOMEN: Protuberant, tender in wide upper quadrant and right lower quadrant. GENITOURINARY: Testicles equal size, appear healthy, no redness or warmth, no evidence of blocked blood flow in scrotum.       No results found for any visits on 02/13/23.  Assessment & Plan     Problem List Items Addressed This Visit       Endocrine   Diabetes mellitus without complication (HCC)    Chronic  Refilled testing supplies  Continue metformin 500mg  twice daily         Other   Swahili language interpreter needed    In person swahili interpreter was present for entire interview and examination of Mr. Steppe      Right sided abdominal pain - Primary    Right-sided abdominal pain Chronic pain radiating to back and testicles, with no clear etiology identified despite multiple specialist consultations. Possible inguinal hernia noted on physical examination. -Order repeat CT scan of abdomen and pelvis. -Refer to gastroenterology for further evaluation. -Schedule H. Pylori breath test.      Relevant Orders   Ambulatory referral to Gastroenterology   Groin pain, right    Patient reports one testicle feels larger than the other, but physical examination did not reveal significant asymmetry or signs of inflammation. -Advise patient to monitor symptoms and report any changes. -ordered repeat CT of abdomen  and pelvic Patient had normal urology evaluation, noted to have urine culture findings of e coli resistant to oral abx, ID evaluated pt and did not recommend treatment for asymptomatic bacteruria       Chronic bilateral low back pain   Other Visit Diagnoses     Right upper quadrant abdominal pain       Relevant Orders   CT ABDOMEN PELVIS WO CONTRAST   Early satiety       Relevant Orders   CT ABDOMEN PELVIS WO CONTRAST   H. pylori breath test   Hepatomegaly       Relevant Orders   CT ABDOMEN PELVIS WO CONTRAST         Follow-up in 1 month to discuss results and further management.         Return in about 1 month (around 03/15/2023) for abd pain, CT follow up .  Ronnald Ramp, MD  [facility] [phone] (phone) [phone] (fax)  [facility]

## 2023-02-15 LAB — H. PYLORI BREATH TEST: H pylori Breath Test: POSITIVE — AB

## 2023-02-16 ENCOUNTER — Telehealth: Payer: Self-pay | Admitting: Family Medicine

## 2023-02-16 ENCOUNTER — Other Ambulatory Visit: Payer: Self-pay | Admitting: Family Medicine

## 2023-02-16 DIAGNOSIS — A048 Other specified bacterial intestinal infections: Secondary | ICD-10-CM

## 2023-02-16 HISTORY — DX: Other specified bacterial intestinal infections: A04.8

## 2023-02-16 MED ORDER — TETRACYCLINE HCL 500 MG PO TABS: 500.0000 mg | ORAL_TABLET | Freq: Four times a day (QID) | ORAL | 0 refills | Status: DC

## 2023-02-16 MED ORDER — BISMUTH SUBSALICYLATE 525 MG PO TABS: 524.0000 mg | ORAL_TABLET | Freq: Four times a day (QID) | ORAL | 0 refills | Status: AC

## 2023-02-16 MED ORDER — BIS SUBCIT-METRONID-TETRACYC 140-125-125 MG PO CAPS
3.0000 | ORAL_CAPSULE | Freq: Three times a day (TID) | ORAL | 0 refills | Status: DC
Start: 2023-02-16 — End: 2023-04-25

## 2023-02-16 MED ORDER — OMEPRAZOLE 20 MG PO CPDR: 20.0000 mg | DELAYED_RELEASE_CAPSULE | Freq: Two times a day (BID) | ORAL | 0 refills | Status: DC

## 2023-02-16 MED ORDER — METRONIDAZOLE 500 MG PO TABS
500.0000 mg | ORAL_TABLET | Freq: Three times a day (TID) | ORAL | 0 refills | Status: DC
Start: 2023-02-16 — End: 2023-02-16

## 2023-02-16 NOTE — Telephone Encounter (Signed)
Covermymeds is requesting prior authorization Key: [id number] Tetracycline HCI 500mg  capsules

## 2023-02-16 NOTE — Telephone Encounter (Signed)
Approved today

## 2023-03-15 ENCOUNTER — Ambulatory Visit: Payer: Medicaid Other | Admitting: Family Medicine

## 2023-03-15 ENCOUNTER — Encounter: Payer: Self-pay | Admitting: Family Medicine

## 2023-03-15 VITALS — BP 108/90 | HR 81 | Resp 16 | Ht 67.0 in | Wt 148.1 lb

## 2023-03-15 DIAGNOSIS — Z7984 Long term (current) use of oral hypoglycemic drugs: Secondary | ICD-10-CM

## 2023-03-15 DIAGNOSIS — N50811 Right testicular pain: Secondary | ICD-10-CM

## 2023-03-15 DIAGNOSIS — A048 Other specified bacterial intestinal infections: Secondary | ICD-10-CM

## 2023-03-15 DIAGNOSIS — E119 Type 2 diabetes mellitus without complications: Secondary | ICD-10-CM | POA: Diagnosis not present

## 2023-03-15 DIAGNOSIS — Z758 Other problems related to medical facilities and other health care: Secondary | ICD-10-CM | POA: Diagnosis not present

## 2023-03-15 DIAGNOSIS — R109 Unspecified abdominal pain: Secondary | ICD-10-CM

## 2023-03-15 DIAGNOSIS — R1031 Right lower quadrant pain: Secondary | ICD-10-CM

## 2023-03-15 HISTORY — DX: Right testicular pain: N50.811

## 2023-03-15 MED ORDER — RYBELSUS 3 MG PO TABS: 3.0000 mg | ORAL_TABLET | Freq: Every day | ORAL | 1 refills | Status: AC

## 2023-03-15 MED ORDER — METFORMIN HCL 500 MG PO TABS
1000.0000 mg | ORAL_TABLET | Freq: Two times a day (BID) | ORAL | 1 refills | Status: DC
Start: 2023-03-15 — End: 2023-05-03

## 2023-03-15 NOTE — Progress Notes (Signed)
Established patient visit   Patient: Vincent [name]   DOB: 03-20-1961   62 y.o. Male  MRN: [id number] Visit Date: 03/15/2023  Today's healthcare provider: Ronnald Ramp, MD   Chief Complaint  Patient presents with   Abdominal Pain    Pain extends from testicle to right kidney   Diabetes   Subjective     HPI     Abdominal Pain    Additional comments: Pain extends from testicle to right kidney        Comments   He reports his blood sugars are too low in the AM and too high in the PM      Last edited by Ashok Cordia, CMA on 03/15/2023  2:13 PM.       Discussed the use of AI scribe software for clinical note transcription with the patient, who gave verbal consent to proceed.  History of Present Illness   The patient presents with ongoing abdominal and testicular pain. He describes a sharp pain in the testicles that radiates to the umbilical region, suspecting an inflamed appendix. Additionally, he reports right-sided abdominal pain that extends to the chest, back, and waist. The patient has been on treatment for a positive H Pylori test, taking Pylera, which he believes has not fully addressed his symptoms.  The patient also reports concerns about his blood sugar levels, noting that they are low in the mornings and high in the evenings. He reports fasting blood sugar levels around 150, postprandial levels around 180, and evening levels ranging from 220 to 300. His most recent A1c was 7.6, an improvement from 9 five months prior, but still above the target of 7. He expresses a desire to lower his A1c to around 5 or 6.  The patient has been taking metformin 500mg  twice a day for his diabetes and is open to increasing the dosage to 1000mg  twice a day. He also expresses willingness to try new medications to better control his blood sugar levels.  The patient has been experiencing these symptoms alongside his ongoing treatment for H Pylori. He has been taking  Pylera, but it is unclear how long he has been on this treatment. He reports no significant changes in his diet, which he believes may be contributing to his elevated blood sugar levels.  The patient also mentions a bill he received, expressing confusion and concern about the charges. He reports financial strain and difficulty affording his medications, despite having Medicaid. He requests assistance in understanding and managing these costs.         Past Medical History:  Diagnosis Date   Patient denies medical problems     Medications: Outpatient Medications Prior to Visit  Medication Sig   ACCU-CHEK GUIDE test strip Use as instructed   Accu-Chek Softclix Lancets lancets Use as instructed   bismuth-metronidazole-tetracycline (PYLERA) 140-125-125 MG capsule Take 3 capsules by mouth 4 (four) times daily -  before meals and at bedtime for 14 days.   gabapentin (NEURONTIN) 100 MG capsule Take 1 capsule (100 mg total) by mouth at bedtime.   omeprazole (PRILOSEC) 20 MG capsule Take 1 capsule (20 mg total) by mouth 2 (two) times daily before a meal for 14 days.   [DISCONTINUED] metFORMIN (GLUCOPHAGE) 500 MG tablet Take 1 tablet (500 mg total) by mouth 2 (two) times daily with a meal.   Blood Glucose Monitoring Suppl (ACCU-CHEK GUIDE) w/Device KIT See admin instructions. (Patient not taking: Reported on 03/15/2023)   Blood Glucose Monitoring  Suppl DEVI 1 each by Does not apply route in the morning, at noon, and at bedtime. May substitute to any manufacturer covered by patient's insurance. (Patient not taking: Reported on 03/15/2023)   hydrOXYzine (ATARAX) 10 MG tablet Take 1 tablet (10 mg total) by mouth 3 (three) times daily as needed. (Patient not taking: Reported on 03/15/2023)   methocarbamol (ROBAXIN) 500 MG tablet Take 1 tablet (500 mg total) by mouth 4 (four) times daily. (Patient not taking: Reported on 03/15/2023)   No facility-administered medications prior to visit.    Review of  Systems      Objective    BP (!) 108/90   Pulse 81   Resp 16   Ht 5\' 7"  (1.702 m)   Wt 148 lb 1.6 oz (67.2 kg)   SpO2 95%   BMI 23.20 kg/m  BP Readings from Last 3 Encounters:  03/15/23 (!) 108/90  02/13/23 112/78  01/16/23 117/85   Wt Readings from Last 3 Encounters:  03/15/23 148 lb 1.6 oz (67.2 kg)  02/13/23 151 lb (68.5 kg)  01/16/23 148 lb 11.2 oz (67.4 kg)        Physical Exam  General: Alert, no acute distress Pulm:  normal work of breathing Abdomen: Bowel sounds normal. Abdomen soft and non-tender. Protuberant abdomen on right side, no fluid wave observed on exam today Extremities: No peripheral edema.  Feet: normal monofilament sensation bilaterally without ulcers or signs of skin breakdown, normal PT and DP pulses   No results found for any visits on 03/15/23.  Assessment & Plan     Problem List Items Addressed This Visit       Endocrine   Diabetes mellitus without complication (HCC) - Primary    Suboptimal glycemic control with morning hypoglycemia and evening hyperglycemia. Current A1c is 7.6%, improved from 9% five months ago but still above the target of <7%. Discussed increasing metformin dose to 1000 mg twice daily. Considered alternative medications like Farxiga or [place] Pepper but decided against them due to potential risk of genital infections. Discussed Rybelsus as an alternative with potential side effects of constipation or diarrhea. He expressed desire to lower A1c to 5-6%.   - chronic problem  - Increase metformin to 1000 mg twice daily   - Consult pharmacy team for Rybelsus  pt assistance  - Social work consult for financial assistance (pt report financial strain)   - Foot exam performed, normal   - encouraged pt to follow up with prior referral for DM nutrition/education as he has questions on what foods to eat to help diabetes reach goal of 5. I recommended avoiding large amounts of bread,pasta, rice and sugary beverages, pt states he does  not consume these foods/drinks  - urine albumin ordered today       Relevant Medications   metFORMIN (GLUCOPHAGE) 500 MG tablet   Semaglutide (RYBELSUS) 3 MG TABS   Other Relevant Orders   AMB Referral VBCI Care Management   Microalbumin / creatinine urine ratio     Other   Swahili language interpreter needed    Ipad interpreter was present for this encounter and PE       Relevant Orders   AMB Referral VBCI Care Management   Right testicular pain    Pt referred to urology       Relevant Orders   Ambulatory referral to Urology   Right sided abdominal pain    Persistent right-sided abdominal pain radiating to the chest, back, and waist. Differential includes appendicitis and  H. pylori infection. Previous CT scan not yet scheduled (scheduled for 03/20/23, pt aware; update from referral coordinator 03/16/23). H. pylori treatment ongoing with Pylera. Repeat testing for hepatitis panel was negative.  - Pt to be scheduled for previously ordered  CT scan of the abdomen   - Follow up with gastroenterologist, referral previously placed, contacted referral coordinator who confirmed that the GI office would call patient again  - Continue Pylera for H. Pylori, discussed recommendation for 2 weeks of therapy  - pt previously referred for colonoscopy       H. pylori infection    Acute  Treatment prescribed on 02/16/23  Complete bismuth-metronidazole-tetracycline (PYLERA) 140-125-125 MG capsule four times daily for 14 days        Groin pain, right    Sharp testicular pain radiating to the umbilicus. Differential includes epididymitis. Current antibiotics for H. pylori may help if infection-related.  Previously evaluated for bacteruria by ID (treatment was not recommended at the time). Pt previously evaluated by urology but reported no pain at time of visit.  - Refer to urologist for further evaluation  for repeat evaluation given ongoing testicular pain, previously concerned for swelling of  right testicle but this was not observed on exam       Relevant Orders   Ambulatory referral to Urology     General Health Maintenance   Discussed dietary modifications for diabetes management, including limiting rice, pasta, bread, fried foods, and sweetened beverages. Referred to a nutritionist for detailed dietary guidance.   - Follow up with nutritionist    Follow-up   - Coordinate with referral coordinator for gastroenterologist and CT scan appointments   - Discuss billing issues with front desk or billing department.         Return in about 3 months (around 06/13/2023) for DM.         Ronnald Ramp, MD  [facility] [phone] (phone) [phone] (fax)  [facility]

## 2023-03-16 ENCOUNTER — Telehealth: Payer: Self-pay

## 2023-03-16 NOTE — Assessment & Plan Note (Addendum)
Suboptimal glycemic control with morning hypoglycemia and evening hyperglycemia. Current A1c is 7.6%, improved from 9% five months ago but still above the target of <7%. Discussed increasing metformin dose to 1000 mg twice daily. Considered alternative medications like Farxiga or [place] Pepper but decided against them due to potential risk of genital infections. Discussed Rybelsus as an alternative with potential side effects of constipation or diarrhea. He expressed desire to lower A1c to 5-6%.   - chronic problem  - Increase metformin to 1000 mg twice daily   - Consult pharmacy team for Rybelsus  pt assistance  - Social work consult for financial assistance (pt report financial strain)   - Foot exam performed, normal   - encouraged pt to follow up with prior referral for DM nutrition/education as he has questions on what foods to eat to help diabetes reach goal of 5. I recommended avoiding large amounts of bread,pasta, rice and sugary beverages, pt states he does not consume these foods/drinks  - urine albumin ordered today

## 2023-03-16 NOTE — Assessment & Plan Note (Signed)
Acute  Treatment prescribed on 02/16/23  Complete bismuth-metronidazole-tetracycline (PYLERA) 140-125-125 MG capsule four times daily for 14 days

## 2023-03-16 NOTE — Assessment & Plan Note (Signed)
Pt referred to urology

## 2023-03-16 NOTE — Assessment & Plan Note (Signed)
Persistent right-sided abdominal pain radiating to the chest, back, and waist. Differential includes appendicitis and H. pylori infection. Previous CT scan not yet scheduled (scheduled for 03/20/23, pt aware; update from referral coordinator 03/16/23). H. pylori treatment ongoing with Pylera. Repeat testing for hepatitis panel was negative.  - Pt to be scheduled for previously ordered  CT scan of the abdomen   - Follow up with gastroenterologist, referral previously placed, contacted referral coordinator who confirmed that the GI office would call patient again  - Continue Pylera for H. Pylori, discussed recommendation for 2 weeks of therapy  - pt previously referred for colonoscopy

## 2023-03-16 NOTE — Assessment & Plan Note (Signed)
Sharp testicular pain radiating to the umbilicus. Differential includes epididymitis. Current antibiotics for H. pylori may help if infection-related.  Previously evaluated for bacteruria by ID (treatment was not recommended at the time). Pt previously evaluated by urology but reported no pain at time of visit.  - Refer to urologist for further evaluation  for repeat evaluation given ongoing testicular pain, previously concerned for swelling of right testicle but this was not observed on exam

## 2023-03-16 NOTE — Progress Notes (Unsigned)
   Care Guide Note  03/16/2023 Name: Vincent [name] MRN: [id number] DOB: 05-Feb-1961  Referred by: Ronnald Ramp, MD Reason for referral : Care Coordination (Outreach to schedule with pharm d )   Vincent [name] is a 62 y.o. year old male who is a primary care patient of Simmons-Robinson, Tawanna Cooler, MD. Vincent [name] was referred to the pharmacist for assistance related to DM.    An unsuccessful telephone outreach was attempted today to contact the patient who was referred to the pharmacy team for assistance with medication management. Additional attempts will be made to contact the patient.   Vincent [name] , Vincent [name]     [facility]  [facility], [facility] Guide  Direct Dial: [phone]  Website: [email or web address]

## 2023-03-16 NOTE — Assessment & Plan Note (Signed)
Ipad interpreter was present for this encounter and PE

## 2023-03-17 LAB — MICROALBUMIN / CREATININE URINE RATIO
Creatinine, Urine: 213.7 mg/dL
Microalb/Creat Ratio: 53 mg/g{creat} — ABNORMAL HIGH (ref 0–29)
Microalbumin, Urine: 112.3 ug/mL

## 2023-03-19 ENCOUNTER — Encounter: Payer: Self-pay | Admitting: Family Medicine

## 2023-03-19 ENCOUNTER — Telehealth: Payer: Self-pay

## 2023-03-19 DIAGNOSIS — E119 Type 2 diabetes mellitus without complications: Secondary | ICD-10-CM

## 2023-03-19 NOTE — Telephone Encounter (Signed)
Copied from CRM [id number]. Topic: Referral - Request for Referral >> Mar 19, 2023  1:42 PM Vincent [name] wrote: Type of order/referral and detailed reason for visit: Pt requesting another referral to nutritionist for his diabetes, as previous one was closed out.  Preference of office, provider, location: [place] area  If referral order, have you been seen by this specialty before? No  Can we respond through MyChart? Yes

## 2023-03-19 NOTE — Addendum Note (Signed)
Addended by: Bing Neighbors on: 03/19/2023 03:50 PM   Modules accepted: Orders

## 2023-03-19 NOTE — Telephone Encounter (Signed)
Referral submitted

## 2023-03-20 ENCOUNTER — Ambulatory Visit
Admission: RE | Admit: 2023-03-20 | Discharge: 2023-03-20 | Disposition: A | Discharge status: Home / Self Care | Payer: Medicaid Other | Source: Ambulatory Visit | Attending: Family Medicine | Admitting: Family Medicine

## 2023-03-20 DIAGNOSIS — R160 Hepatomegaly, not elsewhere classified: Secondary | ICD-10-CM | POA: Insufficient documentation

## 2023-03-20 DIAGNOSIS — R6881 Early satiety: Secondary | ICD-10-CM | POA: Insufficient documentation

## 2023-03-20 DIAGNOSIS — R1011 Right upper quadrant pain: Secondary | ICD-10-CM | POA: Diagnosis present

## 2023-03-22 NOTE — Progress Notes (Signed)
   Care Guide Note  03/22/2023 Name: Vincent [name] MRN: [id number] DOB: 05/23/1960  Referred by: Ronnald Ramp, MD Reason for referral : Care Coordination (Outreach to schedule with pharm d )   Vincent [name] is a 62 y.o. year old male who is a primary care patient of Simmons-Robinson, Tawanna Cooler, MD. Vincent [name] was referred to the pharmacist for assistance related to DM.    Successful contact was made with the patient to discuss pharmacy services including being ready for the pharmacist to call at least 5 minutes before the scheduled appointment time, to have medication bottles and any blood sugar or blood pressure readings ready for review. The patient agreed to meet with the pharmacist via with the pharmacist via telephone visit on (date/time).  03/28/2023  Penne Lash , RMA     [place]  [facility], [facility] Guide  Direct Dial: [phone]  Website: [email or web address]

## 2023-03-27 NOTE — Progress Notes (Deleted)
03/29/2023 9:49 AM   Vincent [name] 27-Nov-1960 [id number]  Referring provider: Ronnald Ramp, MD [address] [place],  [place] [address]  Urological history: 1. BPH with LU TS  -PSA pending   No chief complaint on file.   HPI: Vincent [name] is a 62 y.o. male who presents today for right groin pain.    Previous records reviewed.   He had seen Dr. Lonna Cobb for similar complaints in October 2024.  At that time, he had a negative abdominal CT and a negative scrotal ultrasound.  The CT scan did demonstrate some bladder wall thickening but he had no bothersome lower urinary tract symptoms or symptoms of UTI.  His urinalysis was clear at that visit and it was decided that his pain may be neuropathic in nature and he was undergoing physical therapy for his lower back pain.  The bladder wall thickening on CT was most likely secondary to BPH and since he was emptying his bladder well and not having any symptoms, no treatment was recommended.  He was to follow-up as needed.  Since then he seen his primary care physician with the same complaints.  Another CT scan was completed on March 20, 2023 ****    PMH: Past Medical History:  Diagnosis Date   Patient denies medical problems     Surgical History: Past Surgical History:  Procedure Laterality Date   denies      Home Medications:  Allergies as of 03/29/2023   No Known Allergies      Medication List        Accurate as of March 27, 2023  9:49 AM. If you have any questions, ask your nurse or doctor.          Accu-Chek Guide test strip Generic drug: glucose blood Use as instructed   Accu-Chek Softclix Lancets lancets Use as instructed   bismuth-metronidazole-tetracycline 140-125-125 MG capsule Commonly known as: PYLERA Take 3 capsules by mouth 4 (four) times daily -  before meals and at bedtime for 14 days.   Blood Glucose Monitoring Suppl Devi 1 each by Does not apply  route in the morning, at noon, and at bedtime. May substitute to any manufacturer covered by patient's insurance.   Accu-Chek Guide w/Device Kit See admin instructions.   gabapentin 100 MG capsule Commonly known as: NEURONTIN Take 1 capsule (100 mg total) by mouth at bedtime.   hydrOXYzine 10 MG tablet Commonly known as: ATARAX Take 1 tablet (10 mg total) by mouth 3 (three) times daily as needed.   metFORMIN 500 MG tablet Commonly known as: GLUCOPHAGE Take 2 tablets (1,000 mg total) by mouth 2 (two) times daily with a meal.   methocarbamol 500 MG tablet Commonly known as: ROBAXIN Take 1 tablet (500 mg total) by mouth 4 (four) times daily.   omeprazole 20 MG capsule Commonly known as: PRILOSEC Take 1 capsule (20 mg total) by mouth 2 (two) times daily before a meal for 14 days.   Rybelsus 3 MG Tabs Generic drug: Semaglutide Take 1 tablet (3 mg total) by mouth daily.        Allergies: No Known Allergies  Family History: No family history on file.  Social History:  reports that he quit smoking about 10 years ago. His smoking use included cigarettes. He started smoking about 45 years ago. He has never used smokeless tobacco. He reports that he does not currently use alcohol. He reports that he does not use drugs.  ROS: Pertinent ROS in  HPI  Physical Exam: There were no vitals taken for this visit.  Constitutional:  Well nourished. Alert and oriented, No acute distress. HEENT: [place] AT, moist mucus membranes.  Trachea midline, no masses. Cardiovascular: No clubbing, cyanosis, or edema. Respiratory: Normal respiratory effort, no increased work of breathing. GI: Abdomen is soft, non tender, non distended, no abdominal masses. Liver and spleen not palpable.  No hernias appreciated.  Stool sample for occult testing is not indicated.   GU: No CVA tenderness.  No bladder fullness or masses.  Patient with circumcised/uncircumcised phallus. ***Foreskin easily retracted***  Urethral  meatus is patent.  No penile discharge. No penile lesions or rashes. Scrotum without lesions, cysts, rashes and/or edema.  Testicles are located scrotally bilaterally. No masses are appreciated in the testicles. Left and right epididymis are normal. Rectal: Patient with  normal sphincter tone. Anus and perineum without scarring or rashes. No rectal masses are appreciated. Prostate is approximately *** grams, *** nodules are appreciated. Seminal vesicles are normal. Skin: No rashes, bruises or suspicious lesions. Lymph: No cervical or inguinal adenopathy. Neurologic: Grossly intact, no focal deficits, moving all 4 extremities. Psychiatric: Normal mood and affect.  Laboratory Data: Lab Results  Component Value Date   WBC 5.5 10/16/2022   HGB 14.2 10/16/2022   HCT 42.4 10/16/2022   MCV 87 10/16/2022   PLT 221 10/16/2022    Lab Results  Component Value Date   CREATININE 1.14 01/16/2023    Lab Results  Component Value Date   HGBA1C 7.6 (H) 01/16/2023    Lab Results  Component Value Date   TSH 3.160 10/16/2022    Lab Results  Component Value Date   AST 21 01/16/2023   Lab Results  Component Value Date   ALT 28 01/16/2023    Urinalysis See EPIC and HPI  I have reviewed the labs.   Pertinent Imaging: *** I have independently reviewed the films.    Assessment & Plan:  ***  1. Right groin pain -UA *** -urine culture pending -atypical culture pending -exam ***  2. BPH with LUTS -PSA stable *** -DRE benign *** -UA benign *** -PVR < 300 cc *** -symptoms - *** -most bothersome symptoms are *** -continue conservative management, avoiding bladder irritants and timed voiding's -Initiate alpha-blocker (***), discussed side effects *** -Initiate 5 alpha reductase inhibitor (***), discussed side effects *** -Continue tamsulosin 0.4 mg daily, alfuzosin 10 mg daily, Rapaflo 8 mg daily, terazosin, doxazosin, Cialis 5 mg daily and finasteride 5 mg daily, dutasteride 0.5 mg  daily***:refills given -Cannot tolerate medication or medication failure, schedule cystoscopy ***       No follow-ups on file.  These notes generated with voice recognition software. I apologize for typographical errors.  Cloretta Ned  [facility] [address]  [address] [place], [place] [address] [phone]

## 2023-03-28 ENCOUNTER — Other Ambulatory Visit: Payer: Self-pay | Admitting: Pharmacist

## 2023-03-28 ENCOUNTER — Telehealth: Payer: Self-pay

## 2023-03-28 DIAGNOSIS — E119 Type 2 diabetes mellitus without complications: Secondary | ICD-10-CM

## 2023-03-28 NOTE — Telephone Encounter (Signed)
The patient called in wanting to know about letter that was sent for his wife. I inform him that we have been trying to reach the patient and we want to know if she is available. He said yes, she is available.
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
## 2023-03-28 NOTE — Addendum Note (Signed)
Addended by: Pollie Friar on: 03/28/2023 01:47 PM   Modules accepted: Orders

## 2023-03-29 ENCOUNTER — Ambulatory Visit: Payer: Medicaid Other | Admitting: Urology

## 2023-03-29 DIAGNOSIS — N138 Other obstructive and reflux uropathy: Secondary | ICD-10-CM

## 2023-03-29 DIAGNOSIS — R1031 Right lower quadrant pain: Secondary | ICD-10-CM

## 2023-04-02 ENCOUNTER — Other Ambulatory Visit: Payer: Self-pay | Admitting: Family Medicine

## 2023-04-02 DIAGNOSIS — K5904 Chronic idiopathic constipation: Secondary | ICD-10-CM

## 2023-04-02 MED ORDER — POLYETHYLENE GLYCOL 3350 17 GM/SCOOP PO POWD
17.0000 g | Freq: Two times a day (BID) | ORAL | 1 refills | Status: DC | PRN
Start: 2023-04-02 — End: 2023-12-17

## 2023-04-02 MED ORDER — SENNOSIDES 8.6 MG PO TABS
1.0000 | ORAL_TABLET | Freq: Two times a day (BID) | ORAL | 3 refills | Status: DC
Start: 2023-04-02 — End: 2023-12-17

## 2023-04-09 ENCOUNTER — Telehealth: Payer: Self-pay | Admitting: Family Medicine

## 2023-04-09 NOTE — Telephone Encounter (Signed)
Received fax from Covermymeds for Rybelsus  Key:  [facility]

## 2023-04-16 ENCOUNTER — Other Ambulatory Visit: Payer: Self-pay | Admitting: Family Medicine

## 2023-04-16 DIAGNOSIS — K5904 Chronic idiopathic constipation: Secondary | ICD-10-CM

## 2023-04-16 DIAGNOSIS — E119 Type 2 diabetes mellitus without complications: Secondary | ICD-10-CM

## 2023-04-16 DIAGNOSIS — A048 Other specified bacterial intestinal infections: Secondary | ICD-10-CM

## 2023-04-16 DIAGNOSIS — R109 Unspecified abdominal pain: Secondary | ICD-10-CM

## 2023-04-16 NOTE — Telephone Encounter (Signed)
 Medication Refill -  Most Recent Primary Care Visit:  Provider: SIMMONS-ROBINSON, [name]  Department: [facility]  Visit Type: OFFICE VISIT  Date: 03/15/2023  Medication: polyethylene glycol powder (GLYCOLAX /MIRALAX ) 17 GM/SCOOP powder , senna (SENOKOT) 8.6 MG tablet .Pt is also requesting his diabetic medication did not know the name.    Pt stated he went to pick up his medication and did not have the money for it, so he left, and when he came back with the money, they told him they did not have the medication anymore. Please advise.   Has the patient contacted their pharmacy? Yes (Agent: If no, request that the patient contact the pharmacy for the refill. If patient does not wish to contact the pharmacy document the reason why and proceed with request.)   Is this the correct pharmacy for this prescription? Yes If no, delete pharmacy and type the correct one.  This is the patient's preferred pharmacy:  [facility] [address] [place] - [address] [place] [address] Phone: [phone] Fax: [phone]   Has the prescription been filled recently? Yes  Is the patient out of the medication? Yes  Has the patient been seen for an appointment in the last year OR does the patient have an upcoming appointment? Yes  Can we respond through MyChart? Yes  Agent: Please be advised that Rx refills may take up to 3 business days. We ask that you follow-up with your pharmacy.

## 2023-04-16 NOTE — Telephone Encounter (Signed)
 Medication Refill -  Most Recent Primary Care Visit:  Provider: SIMMONS-ROBINSON, [name]  Department: [facility]  Visit Type: OFFICE VISIT  Date: 03/15/2023  Medication: metFORMIN  (GLUCOPHAGE ) 500 MG tablet [[id number]]   Tetracycline  HCl 500 MG TABS [[id number]]  DISCONTINUED   polyethylene glycol powder (GLYCOLAX /MIRALAX ) 17 GM/SCOOP powder [[id number]]   gabapentin  (NEURONTIN ) 100 MG capsule [[id number]]   Has the patient contacted their pharmacy? Yes (Agent: If no, request that the patient contact the pharmacy for the refill. If patient does not wish to contact the pharmacy document the reason why and proceed with request.) (Agent: If yes, when and what did the pharmacy advise?)  Is this the correct pharmacy for this prescription? Yes If no, delete pharmacy and type the correct one.  This is the patient's preferred pharmacy:  [facility] [address], [place] - [address] [place] [address] Phone: [phone] Fax: [phone]   Has the prescription been filled recently? Yes  Is the patient out of the medication? Yes  Has the patient been seen for an appointment in the last year OR does the patient have an upcoming appointment? Yes  Can we respond through MyChart? Yes  Agent: Please be advised that Rx refills may take up to 3 business days. We ask that you follow-up with your pharmacy.

## 2023-04-17 ENCOUNTER — Ambulatory Visit: Payer: Self-pay | Admitting: *Deleted

## 2023-04-17 NOTE — Telephone Encounter (Signed)
  Chief Complaint: abdominal pain, testicle pain Symptoms: R abdominal pain- R testicle pain- chronic- patient has been seen in office and evaluated- he is still having pain he rates as severe.  Frequency: constant- but worse with eating Pertinent Negatives: Patient denies fever, diarrhea, vomiting Disposition: [x] ED /[] Urgent Care (no appt availability in office) / [] Appointment(In office/virtual)/ []  [place] [facility]/ [] Home Care/ [] Refused Recommended Disposition /[] [place] [facility]/ []  Follow-up with PCP Additional Notes: Patient has been advised ED for severe pain evaluation- he is very concerned. Patient stated he is yet to get diagnosis for this and states he has been advised he has infection- then told he does not. Patient seems frustrated because he has not gotten answers and is not feeling well. Patient states he will go to ED- but still wants to see PCP-  message will be sent to schedule. Interpreter: Feisal#feil.    Reason for Disposition . [1] SEVERE pain AND [2] age > 60 years  Answer Assessment - Initial Assessment Questions 1. LOCATION: Where does it hurt?      R side- unable to eat- causes pain 2. RADIATION: Does the pain shoot anywhere else? (e.g., chest, back)     R testicle pain 3. ONSET: When did the pain begin? (Minutes, hours or days ago)      1 month 4. SUDDEN: Gradual or sudden onset?     ongoing 5. PATTERN Does the pain come and go, or is it constant?    - If it comes and goes: How long does it last? Do you have pain now?     (Note: Comes and goes means the pain is intermittent. It goes away completely between bouts.)    - If constant: Is it getting better, staying the same, or getting worse?      (Note: Constant means the pain never goes away completely; most serious pain is constant and gets worse.)      constant 6. SEVERITY: How bad is the pain?  (e.g., Scale 1-10; mild, moderate, or severe)    - MILD (1-3): Doesn't interfere with  normal activities, abdomen soft and not tender to touch.     - MODERATE (4-7): Interferes with normal activities or awakens from sleep, abdomen tender to touch.     - SEVERE (8-10): Excruciating pain, doubled over, unable to do any normal activities.       Severe-8-9/10 7. RECURRENT SYMPTOM: Have you ever had this type of stomach pain before? If Yes, ask: When was the last time? and What happened that time?      no 8. CAUSE: What do you think is causing the stomach pain?     infection 9. RELIEVING/AGGRAVATING FACTORS: What makes it better or worse? (e.g., antacids, bending or twisting motion, bowel movement)     Eating makes it worse 10. OTHER SYMPTOMS: Do you have any other symptoms? (e.g., back pain, diarrhea, fever, urination pain, vomiting)       Testicular pain,  Protocols used: Abdominal Pain - Male-A-AH

## 2023-04-18 IMAGING — CR DG CHEST 1V
1 series · 1 of 1 positions shown · non-contrast
Comparison: No priors.
COMPARISON: No priors.

Addendum:
CLINICAL DATA: 61-year-old male with history of positive
QuantiFERON TB gold test.

EXAM:
CHEST  1 VIEW

[chest pa]
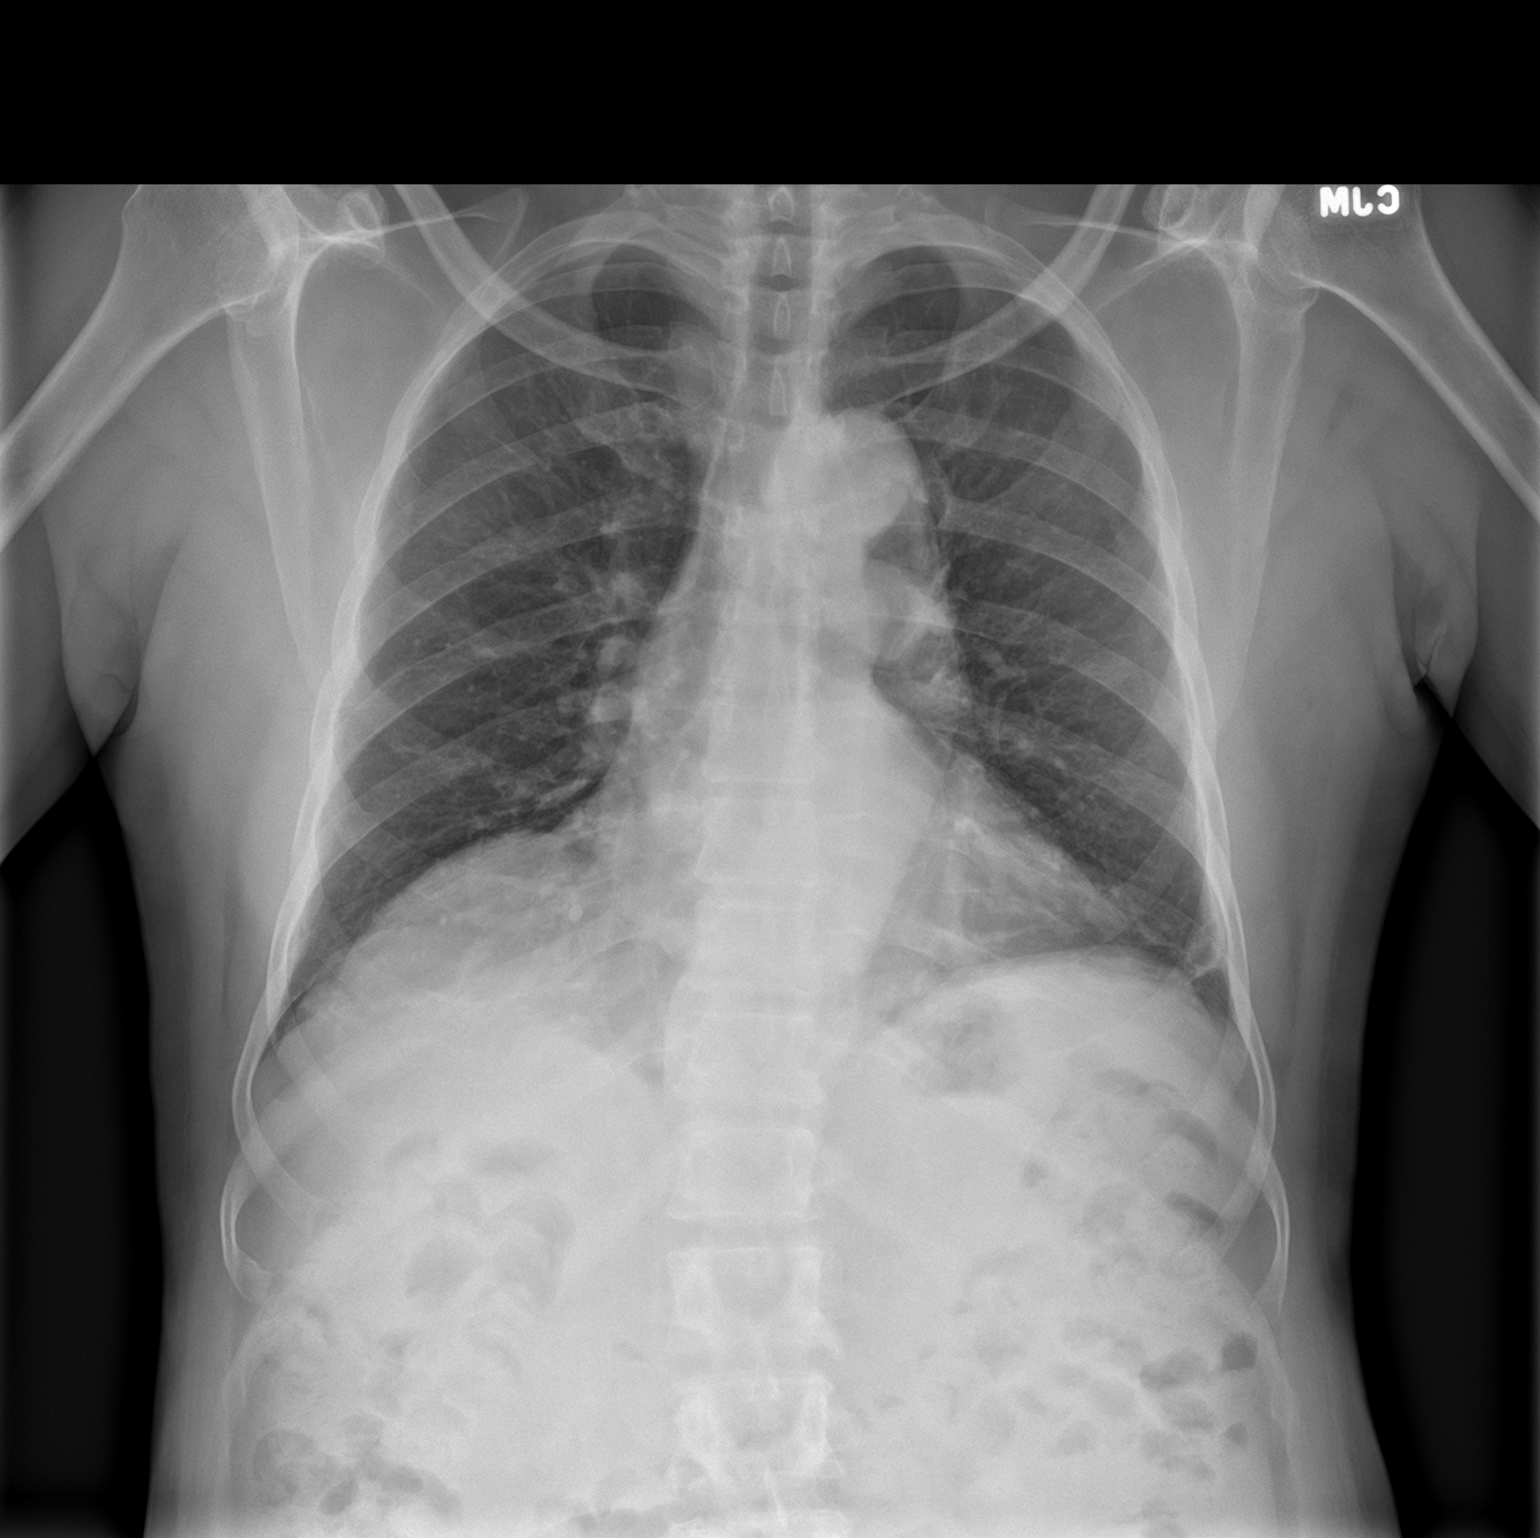

[1 of 1 positions shown; findings below may reference images not displayed]

FINDINGS: Lung volumes are normal. No consolidative airspace disease. No
pleural effusions. No pneumothorax. No pulmonary nodule or mass
noted. Pulmonary vasculature and the cardiomediastinal silhouette
are within normal limits.
IMPRESSION: No radiographic evidence of acute cardiopulmonary disease.

ADDENDUM:
Additionally, there is no radiographic evidence to suggest active
intrathoracic tuberculosis.

*** End of Addendum ***
FINDINGS: Lung volumes are normal. No consolidative airspace disease. No
pleural effusions. No pneumothorax. No pulmonary nodule or mass
noted. Pulmonary vasculature and the cardiomediastinal silhouette
are within normal limits.
IMPRESSION: No radiographic evidence of acute cardiopulmonary disease.

## 2023-04-18 NOTE — Telephone Encounter (Signed)
 Refilled 04/02/23. Requested Prescriptions  Refused Prescriptions Disp Refills   senna (SENOKOT) 8.6 MG tablet 60 tablet 3    Sig: Take 1 tablet (8.6 mg total) by mouth 2 (two) times daily.     Over the Counter:  OTC Passed - 04/18/2023  3:15 PM      Passed - Valid encounter within last 12 months    Recent Outpatient Visits           1 month ago Diabetes mellitus without complication (HCC)   [place] [facility] Simmons-Robinson, [place], MD   2 months ago Right sided abdominal pain   [facility] [place], [place], MD   3 months ago Hepatitis B core antibody positive   [place] [facility] [place], [place], MD   4 months ago Diabetes mellitus without complication [facility])   [place] [place], [place], PA-C   4 months ago Urinary tract infection without hematuria, site unspecified   [place] [facility] [place], [place], PA-C       Future Appointments             In 1 week Helon, Clotilda DELENA RIGGERS [facility] [place]   In 1 month Simmons-Robinson, [place], MD [facility], [facility]             polyethylene glycol powder (GLYCOLAX /MIRALAX ) 17 GM/SCOOP powder 3350 g 1    Sig: Take 17 g by mouth 2 (two) times daily as needed.     Gastroenterology:  Laxatives Passed - 04/18/2023  3:15 PM      Passed - Valid encounter within last 12 months    Recent Outpatient Visits           1 month ago Diabetes mellitus without complication (HCC)   [place] [facility] Simmons-Robinson, [place], MD   2 months ago Right sided abdominal pain   [place] [facility] [place], [place], MD   3 months ago Hepatitis B core antibody positive   [place] [facility] Simmons-Robinson, [place], MD   4 months ago Diabetes mellitus without complication [facility])   [place] [facility] [place], [place], PA-C   4 months ago Urinary tract infection without hematuria, site unspecified   [place] [facility] [place], [place], PA-C       Future Appointments             In 1 week McGowan, Clotilda DELENA RIGGERS [facility] [place]   In 1 month Simmons-Robinson, [place], MD [facility], [facility]             metFORMIN  (GLUCOPHAGE ) 500 MG tablet 180 tablet 1    Sig: Take 2 tablets (1,000 mg total) by mouth 2 (two) times daily with a meal.     Endocrinology:  Diabetes - Biguanides Failed - 04/18/2023  3:15 PM      Failed - B12 Level in normal range and within 720 days    No results found for: VITAMINB12       Passed - Cr in normal range and within 360 days    Creatinine, Ser  Date Value Ref Range Status  01/16/2023 1.14 0.76 - 1.27 mg/dL Final         Passed - HBA1C is between 0 and 7.9 and within 180 days    Hgb A1c MFr Bld  Date Value Ref Range Status  01/16/2023 7.6 (H) 4.8 - 5.6 %  Final    Comment:             Prediabetes: 5.7 - 6.4          Diabetes: >6.4          Glycemic control for adults with diabetes: <7.0          Passed - eGFR in normal range and within 360 days    GFR, Estimated  Date Value Ref Range Status  03/11/2022 >60 >60 mL/min Final    Comment:    (NOTE) Calculated using the CKD-EPI Creatinine Equation (2021)    eGFR  Date Value Ref Range Status  01/16/2023 73 >59 mL/min/1.73 Final         Passed - Valid encounter within last 6 months    Recent Outpatient Visits           1 month ago Diabetes mellitus without complication (HCC)   [facility] Simmons-Robinson, [place], MD   2 months ago Right sided abdominal pain   [place] [facility] Simmons-Robinson, [place], MD   3 months ago Hepatitis B core antibody positive   [place] [facility] Simmons-Robinson, [place], MD   4 months ago Diabetes mellitus without  complication (HCC)   [place] [facility] [place], [place], PA-C   4 months ago Urinary tract infection without hematuria, site unspecified   [place] [facility] [place], [place], PA-C       Future Appointments             In 1 week McGowan, Clotilda DELENA RIGGERS [facility] [place]   In 1 month Simmons-Robinson, [place], [facility], [facility]            Passed - CBC within normal limits and completed in the last 12 months    WBC  Date Value Ref Range Status  10/16/2022 5.5 3.4 - 10.8 x10E3/uL Final  03/11/2022 5.3 4.0 - 10.5 K/uL Final   RBC  Date Value Ref Range Status  10/16/2022 4.85 4.14 - 5.80 x10E6/uL Final  03/11/2022 5.00 4.22 - 5.81 MIL/uL Final   Hemoglobin  Date Value Ref Range Status  10/16/2022 14.2 13.0 - 17.7 g/dL Final   Hematocrit  Date Value Ref Range Status  10/16/2022 42.4 37.5 - 51.0 % Final   MCHC  Date Value Ref Range Status  10/16/2022 33.5 31.5 - 35.7 g/dL Final  87/97/7976 67.3 30.0 - 36.0 g/dL Final   [facility]  Date Value Ref Range Status  10/16/2022 29.3 26.6 - 33.0 pg Final  03/11/2022 29.0 26.0 - 34.0 pg Final   MCV  Date Value Ref Range Status  10/16/2022 87 79 - 97 fL Final   No results found for: PLTCOUNTKUC, LABPLAT, POCPLA RDW  Date Value Ref Range Status  10/16/2022 11.4 (L) 11.6 - 15.4 % Final

## 2023-04-18 NOTE — Telephone Encounter (Signed)
 Requested medication (s) are due for refill today - no  Requested medication (s) are on the active medication list -no  Future visit scheduled -yes  Last refill: medication not listed on current list  Notes to clinic: off protocol- provider review   Requested Prescriptions  Pending Prescriptions Disp Refills   Tetracycline  HCl 500 MG TABS 56 each 0    Sig: Take 500 mg by mouth in the morning, at noon, in the evening, and at bedtime for 14 days.     Off-Protocol Failed - 04/18/2023  4:14 PM      Failed - Medication not assigned to a protocol, review manually.      Passed - Valid encounter within last 12 months    Recent Outpatient Visits           1 month ago Diabetes mellitus without complication (HCC)   [place] [facility] Simmons-Robinson, [place], MD   2 months ago Right sided abdominal pain   [place] [facility] [place], [place], MD   3 months ago Hepatitis B core antibody positive   [place] [facility] Simmons-Robinson, [place], MD   4 months ago Diabetes mellitus without complication [facility])   [place] [place], [place], PA-C   4 months ago Urinary tract infection without hematuria, site unspecified   [facility] [place], [place], PA-C       Future Appointments             In 1 week McGowan, Clotilda DELENA RIGGERS [facility] [place]   In 1 month Simmons-Robinson, [place], MD [facility], [facility]            Refused Prescriptions Disp Refills   metFORMIN  (GLUCOPHAGE ) 500 MG tablet 180 tablet 1    Sig: Take 2 tablets (1,000 mg total) by mouth 2 (two) times daily with a meal.     Endocrinology:  Diabetes - Biguanides Failed - 04/18/2023  4:14 PM      Failed - B12 Level in normal range and within 720 days    No results found for: VITAMINB12       Passed - Cr in normal range and within 360 days    Creatinine, Ser  Date  Value Ref Range Status  01/16/2023 1.14 0.76 - 1.27 mg/dL Final         Passed - HBA1C is between 0 and 7.9 and within 180 days    Hgb A1c MFr Bld  Date Value Ref Range Status  01/16/2023 7.6 (H) 4.8 - 5.6 % Final    Comment:             Prediabetes: 5.7 - 6.4          Diabetes: >6.4          Glycemic control for adults with diabetes: <7.0          Passed - eGFR in normal range and within 360 days    GFR, Estimated  Date Value Ref Range Status  03/11/2022 >60 >60 mL/min Final    Comment:    (NOTE) Calculated using the CKD-EPI Creatinine Equation (2021)    eGFR  Date Value Ref Range Status  01/16/2023 73 >59 mL/min/1.73 Final         Passed - Valid encounter within last 6 months    Recent Outpatient Visits           1 month ago Diabetes mellitus without complication (HCC)  [place] [facility] Simmons-Robinson, [place], MD   2 months ago Right sided abdominal pain   [place] [facility] [place], [place], MD   3 months ago Hepatitis B core antibody positive   [place] [facility] [place], [place]uc[place], MD   4 months ago Diabetes mellitus without complication [facility])   [place] [facility] [place], [place], PA-C   4 months ago Urinary tract infection without hematuria, site unspecified   [place] [facility] [place], [place], PA-C       Future Appointments             In 1 week McGowan, Clotilda DELENA RIGGERS [facility] [place]   In 1 month Simmons-Robinson, [place], MD [facility], [facility]            Passed - CBC within normal limits and completed in the last 12 months    WBC  Date Value Ref Range Status  10/16/2022 5.5 3.4 - 10.8 x10E3/uL Final  03/11/2022 5.3 4.0 - 10.5 K/uL Final   RBC  Date Value Ref Range Status  10/16/2022 4.85 4.14 - 5.80 x10E6/uL Final  03/11/2022 5.00 4.22 - 5.81 MIL/uL Final   Hemoglobin  Date Value  Ref Range Status  10/16/2022 14.2 13.0 - 17.7 g/dL Final   Hematocrit  Date Value Ref Range Status  10/16/2022 42.4 37.5 - 51.0 % Final   MCHC  Date Value Ref Range Status  10/16/2022 33.5 31.5 - 35.7 g/dL Final  87/97/7976 67.3 30.0 - 36.0 g/dL Final   [facility]  Date Value Ref Range Status  10/16/2022 29.3 26.6 - 33.0 pg Final  03/11/2022 29.0 26.0 - 34.0 pg Final   MCV  Date Value Ref Range Status  10/16/2022 87 79 - 97 fL Final   No results found for: PLTCOUNTKUC, LABPLAT, POCPLA RDW  Date Value Ref Range Status  10/16/2022 11.4 (L) 11.6 - 15.4 % Final          gabapentin  (NEURONTIN ) 100 MG capsule 90 capsule 3    Sig: Take 1 capsule (100 mg total) by mouth at bedtime.     Neurology: Anticonvulsants - gabapentin  Passed - 04/18/2023  4:14 PM      Passed - Cr in normal range and within 360 days    Creatinine, Ser  Date Value Ref Range Status  01/16/2023 1.14 0.76 - 1.27 mg/dL Final         Passed - Completed PHQ-2 or PHQ-9 in the last 360 days      Passed - Valid encounter within last 12 months    Recent Outpatient Visits           1 month ago Diabetes mellitus without complication (HCC)   [place] [facility] Simmons-Robinson, [place], MD   2 months ago Right sided abdominal pain   [place] [facility] [place], [place], MD   3 months ago Hepatitis B core antibody positive   [facility] [place], [place], MD   4 months ago Diabetes mellitus without complication [facility])   [place] [facility] [place], [place], PA-C   4 months ago Urinary tract infection without hematuria, site unspecified    [facility] [place], [place], PA-C       Future Appointments             In 1 week McGowan, Clotilda DELENA RIGGERS [facility]   In 1 month Simmons-Robinson, [place],  MD [facility], [facility]              polyethylene glycol powder (GLYCOLAX /MIRALAX ) 17 GM/SCOOP powder 3350 g 1    Sig: Take 17 g by mouth 2 (two) times daily as needed.     Gastroenterology:  Laxatives Passed - 04/18/2023  4:14 PM      Passed - Valid encounter within last 12 months    Recent Outpatient Visits           1 month ago Diabetes mellitus without complication (HCC)   [place] [facility] Simmons-Robinson, [place], MD   2 months ago Right sided abdominal pain   [place] [place], [place], MD   3 months ago Hepatitis B core antibody positive   [place] [facility] [place], [place], MD   4 months ago Diabetes mellitus without complication [facility])   [place] [facility] [place], Rut[place], PA-C   4 months ago Urinary tract infection without hematuria, site unspecified   [place] [facility] Wiseman, Berth[place], PA-C       Future Appointments             In 1 week McGowan, Clotilda DELENA RIGGERS [facility] [place]   In 1 month Simmons-Robinson, [place], MD [facility]               Requested Prescriptions  Pending Prescriptions Disp Refills   Tetracycline  HCl 500 MG TABS 56 each 0    Sig: Take 500 mg by mouth in the morning, at noon, in the evening, and at bedtime for 14 days.     Off-Protocol Failed - 04/18/2023  4:14 PM      Failed - Medication not assigned to a protocol, review manually.      Passed - Valid encounter within last 12 months    Recent Outpatient Visits           1 month ago Diabetes mellitus without complication (HCC)   [facility] Simmons-Robinson, [place], MD   2 months ago Right sided abdominal pain   [place] [facility] [place] [place], [place], MD   3 months ago Hepatitis B core antibody positive   [place] Simmons-Robinson, [place], MD   4 months ago  Diabetes mellitus without complication [facility])   [place] [facility] [place], [place], PA-C   4 months ago Urinary tract infection without hematuria, site unspecified   [place] [facility] [place], [place], PA-C       Future Appointments             In 1 week McGowan, Clotilda DELENA RIGGERS [facility] [place]   In 1 month Simmons-Robinson, [place], MD [facility], [facility]            Refused Prescriptions Disp Refills   metFORMIN  (GLUCOPHAGE ) 500 MG tablet 180 tablet 1    Sig: Take 2 tablets (1,000 mg total) by mouth 2 (two) times daily with a meal.     Endocrinology:  Diabetes - Biguanides Failed - 04/18/2023  4:14 PM      Failed - B12 Level in normal range and within 720 days    No results found for: VITAMINB12       Passed - Cr in normal range and within 360 days    Creatinine, Ser  Date Value Ref Range Status  01/16/2023 1.14 0.76 -  1.27 mg/dL Final         Passed - HBA1C is between 0 and 7.9 and within 180 days    Hgb A1c MFr Bld  Date Value Ref Range Status  01/16/2023 7.6 (H) 4.8 - 5.6 % Final    Comment:             Prediabetes: 5.7 - 6.4          Diabetes: >6.4          Glycemic control for adults with diabetes: <7.0          Passed - eGFR in normal range and within 360 days    GFR, Estimated  Date Value Ref Range Status  03/11/2022 >60 >60 mL/min Final    Comment:    (NOTE) Calculated using the CKD-EPI Creatinine Equation (2021)    eGFR  Date Value Ref Range Status  01/16/2023 73 >59 mL/min/1.73 Final         Passed - Valid encounter within last 6 months    Recent Outpatient Visits           1 month ago Diabetes mellitus without complication (HCC)   [place] Simmons-Robinson, Financial Controller, MD   2 months ago Right sided abdominal pain   [place] [facility] Simmons-Robinson, [place], MD   3 months ago Hepatitis B core antibody positive    [place] [facility] Simmons-Robinson, [place], MD   4 months ago Diabetes mellitus without complication (HCC)   [place] [facility] [place], Herbst, PA-C   4 months ago Urinary tract infection without hematuria, site unspecified   [place] [facility] [place], Elizabeth, PA-C       Future Appointments             In 1 week McGowan, Clotilda DELENA RIGGERS [facility] [place]   In 1 month Simmons-Robinson, [place], MD [facility], [facility]            Passed - CBC within normal limits and completed in the last 12 months    WBC  Date Value Ref Range Status  10/16/2022 5.5 3.4 - 10.8 x10E3/uL Final  03/11/2022 5.3 4.0 - 10.5 K/uL Final   RBC  Date Value Ref Range Status  10/16/2022 4.85 4.14 - 5.80 x10E6/uL Final  03/11/2022 5.00 4.22 - 5.81 MIL/uL Final   Hemoglobin  Date Value Ref Range Status  10/16/2022 14.2 13.0 - 17.7 g/dL Final   Hematocrit  Date Value Ref Range Status  10/16/2022 42.4 37.5 - 51.0 % Final   MCHC  Date Value Ref Range Status  10/16/2022 33.5 31.5 - 35.7 g/dL Final  87/97/7976 67.3 30.0 - 36.0 g/dL Final   [facility]  Date Value Ref Range Status  10/16/2022 29.3 26.6 - 33.0 pg Final  03/11/2022 29.0 26.0 - 34.0 pg Final   MCV  Date Value Ref Range Status  10/16/2022 87 79 - 97 fL Final   No results found for: PLTCOUNTKUC, LABPLAT, POCPLA RDW  Date Value Ref Range Status  10/16/2022 11.4 (L) 11.6 - 15.4 % Final          gabapentin  (NEURONTIN ) 100 MG capsule 90 capsule 3    Sig: Take 1 capsule (100 mg total) by mouth at bedtime.     Neurology: Anticonvulsants - gabapentin  Passed - 04/18/2023  4:14 PM      Passed - Cr in normal range and within 360 days  Creatinine, Ser  Date Value Ref Range Status  01/16/2023 1.14 0.76 - 1.27 mg/dL Final         Passed - Completed PHQ-2 or PHQ-9 in the last 360 days      Passed - Valid encounter within last 12 months     Recent Outpatient Visits           1 month ago Diabetes mellitus without complication (HCC)   [place] [facility] Simmons-Robinson, [place], MD   2 months ago Right sided abdominal pain   [facility] [place], [place], MD   3 months ago Hepatitis B core antibody positive   [place] [facility] [place], Sar[place], MD   4 months ago Diabetes mellitus without complication [facility])   [place] [facility] [name], [place]elle, PA-C   4 months ago Urinary tract infection without hematuria, site unspecified   [place] [facility] [place], [place], PA-C       Future Appointments             In 1 week McGowan, Clotilda DELENA RIGGERS [facility] [place]   In 1 month Simmons-Robinson, [place], MD [facility], [facility]             polyethylene glycol powder (GLYCOLAX /MIRALAX ) 17 GM/SCOOP powder 3350 g 1    Sig: Take 17 g by mouth 2 (two) times daily as needed.     Gastroenterology:  Laxatives Passed - 04/18/2023  4:14 PM      Passed - Valid encounter within last 12 months    Recent Outpatient Visits           1 month ago Diabetes mellitus without complication (HCC)   [place] [facility] Simmons-Robinson, [place], MD   2 months ago Right sided abdominal pain   [place] [facility] [place], [place], MD   3 months ago Hepatitis B core antibody positive   [place] [facility] Bag[place], [place], MD   4 months ago Diabetes mellitus without complication [facility])   [place] [facility] [place], [place], PA-C   4 months ago Urinary tract infection without hematuria, site unspecified    [facility] [place], [place], PA-C       Future Appointments             In 1 week McGowan, Clotilda DELENA RIGGERS [facility] [place]   In 1 month Simmons-Robinson,  [place], MD [facility], [place]

## 2023-04-18 NOTE — Telephone Encounter (Signed)
 Not on active list. He reported not taking in December 2024. Is this because a prior authorization is needed? Please let us know if we need to complete PA.

## 2023-04-18 NOTE — Telephone Encounter (Signed)
 Call to pharmacy- they have refills on all current medications and will have them ready tomorrow- $12. Requested Prescriptions  Pending Prescriptions Disp Refills   metFORMIN  (GLUCOPHAGE ) 500 MG tablet 180 tablet 1    Sig: Take 2 tablets (1,000 mg total) by mouth 2 (two) times daily with a meal.     Endocrinology:  Diabetes - Biguanides Failed - 04/18/2023  4:12 PM      Failed - B12 Level in normal range and within 720 days    No results found for: VITAMINB12       Passed - Cr in normal range and within 360 days    Creatinine, Ser  Date Value Ref Range Status  01/16/2023 1.14 0.76 - 1.27 mg/dL Final         Passed - HBA1C is between 0 and 7.9 and within 180 days    Hgb A1c MFr Bld  Date Value Ref Range Status  01/16/2023 7.6 (H) 4.8 - 5.6 % Final    Comment:             Prediabetes: 5.7 - 6.4          Diabetes: >6.4          Glycemic control for adults with diabetes: <7.0          Passed - eGFR in normal range and within 360 days    GFR, Estimated  Date Value Ref Range Status  03/11/2022 >60 >60 mL/min Final    Comment:    (NOTE) Calculated using the CKD-EPI Creatinine Equation (2021)    eGFR  Date Value Ref Range Status  01/16/2023 73 >59 mL/min/1.73 Final         Passed - Valid encounter within last 6 months    Recent Outpatient Visits           1 month ago Diabetes mellitus without complication (HCC)   [place] [facility] Simmons-Robinson, [place], MD   2 months ago Right sided abdominal pain   [place] [facility] [place], [place], MD   3 months ago Hepatitis B core antibody positive   [place] [facility] Simmons-Robinson, Olla, MD   4 months ago Diabetes mellitus without complication (HCC)   [place] [facility] [place]ain, [place], PA-C   4 months ago Urinary tract infection without hematuria, site unspecified   [place] [facility] [place], [place], PA-C        Future Appointments             In 1 week McGowan, Clotilda DELENA RIGGERS [facility] [place]   In 1 month Simmons-Robinson, [place], MD [name], [facility]            Passed - CBC within normal limits and completed in the last 12 months    WBC  Date Value Ref Range Status  10/16/2022 5.5 3.4 - 10.8 x10E3/uL Final  03/11/2022 5.3 4.0 - 10.5 K/uL Final   RBC  Date Value Ref Range Status  10/16/2022 4.85 4.14 - 5.80 x10E6/uL Final  03/11/2022 5.00 4.22 - 5.81 MIL/uL Final   Hemoglobin  Date Value Ref Range Status  10/16/2022 14.2 13.0 - 17.7 g/dL Final   Hematocrit  Date Value Ref Range Status  10/16/2022 42.4 37.5 - 51.0 % Final   MCHC  Date Value Ref Range Status  10/16/2022 33.5 31.5 - 35.7 g/dL Final  87/97/7976 67.3 30.0 - 36.0 g/dL Final   [facility]  Date Value  Ref Range Status  10/16/2022 29.3 26.6 - 33.0 pg Final  03/11/2022 29.0 26.0 - 34.0 pg Final   MCV  Date Value Ref Range Status  10/16/2022 87 79 - 97 fL Final   No results found for: PLTCOUNTKUC, LABPLAT, POCPLA RDW  Date Value Ref Range Status  10/16/2022 11.4 (L) 11.6 - 15.4 % Final          gabapentin  (NEURONTIN ) 100 MG capsule 90 capsule 3    Sig: Take 1 capsule (100 mg total) by mouth at bedtime.     Neurology: Anticonvulsants - gabapentin  Passed - 04/18/2023  4:12 PM      Passed - Cr in normal range and within 360 days    Creatinine, Ser  Date Value Ref Range Status  01/16/2023 1.14 0.76 - 1.27 mg/dL Final         Passed - Completed PHQ-2 or PHQ-9 in the last 360 days      Passed - Valid encounter within last 12 months    Recent Outpatient Visits           1 month ago Diabetes mellitus without complication (HCC)   [place] [facility] Simmons-Robinson, [place], MD   2 months ago Right sided abdominal pain   [facility], Blanch[place], MD   3 months ago Hepatitis B core antibody positive    [place] [facility] [place], [place], MD   4 months ago Diabetes mellitus without complication [facility])   [place] [facility] [place], [place], PA-C   4 months ago Urinary tract infection without hematuria, site unspecified   [place] [facility] [place], [place], PA-C       Future Appointments             In 1 week McGowan, Clotilda DELENA RIGGERS [place] [facility] [place]   In 1 month Simmons-Robinson, [place], MD [facility], [facility]             polyethylene glycol powder (GLYCOLAX /MIRALAX ) 17 GM/SCOOP powder 3350 g 1    Sig: Take 17 g by mouth 2 (two) times daily as needed.     Gastroenterology:  Laxatives Passed - 04/18/2023  4:12 PM      Passed - Valid encounter within last 12 months    Recent Outpatient Visits           1 month ago Diabetes mellitus without complication (HCC)   [place] [facility] Simmons-Robinson, [place], MD   2 months ago Right sided abdominal pain   [place] [facility] [place], [place], MD   3 months ago Hepatitis B core antibody positive   [place] [facility] [place], [place], MD   4 months ago Diabetes mellitus without complication [facility])   [place] [facility] [place], Russell, PA-C   4 months ago Urinary tract infection without hematuria, site unspecified   [place] [facility] [place], [place], PA-C       Future Appointments             In 1 week McGowan, Clotilda DELENA RIGGERS [facility] [place]   In 1 month Simmons-Robinson, [place], MD [facility], [facility]             Tetracycline  HCl 500 MG TABS 56 each 0    Sig: Take 500 mg by mouth in the morning, at noon, in the evening, and at bedtime for 14 days.  Off-Protocol Failed - 04/18/2023  4:12 PM      Failed - Medication not assigned to a protocol, review manually.       Passed - Valid encounter within last 12 months    Recent Outpatient Visits           1 month ago Diabetes mellitus without complication (HCC)   [place] [facility] Simmons-Robinson, [place], MD   2 months ago Right sided abdominal pain   [place] [facility] [place], [place], MD   3 months ago Hepatitis B core antibody positive   [place] [place], [place], MD   4 months ago Diabetes mellitus without complication [facility])   [place] [facility] [place], [place], PA-C   4 months ago Urinary tract infection without hematuria, site unspecified   [place] [facility] [place], [place], PA-C       Future Appointments             In 1 week McGowan, Clotilda DELENA RIGGERS [facility] [place]   In 1 month Simmons-Robinson, [place], MD [facility], [place]

## 2023-04-18 NOTE — Telephone Encounter (Signed)
 Agree with ED evaluation for severe pain prior to scheduled specialist visits   Patient should also follow up as scheduled with both urology (for testicular pain) and gastroenterology (for abdominal pain as well as follow up for H pylori infection that has been treated)

## 2023-04-19 NOTE — Telephone Encounter (Signed)
 PA submitted in Cover My Meds for Rybelsus    Vincent [name] (Key: [facility]) Rx #: [id number] Rybelsus 3MG  tablets Form [facility] Electronic [place] Form [phone] NCPDP)

## 2023-04-19 NOTE — Telephone Encounter (Signed)
 Pt does need refill for rybelsus

## 2023-04-19 NOTE — Telephone Encounter (Signed)
 Patient does need prescription for rybelsus for diabetes management. Please proceed with PA

## 2023-04-21 NOTE — Progress Notes (Signed)
 04/25/2023 10:52 AM   Vincent [name] Sep 27, 1960 [id number]  Referring provider: Mimi Alt, MD [address] [place],  [place] [address]  Urological history: 1. BPH with LU TS  -PSA pending   2. Renal cyst -CT (03/2023) - 15 mm right renal cyst   Chief Complaint  Patient presents with   Testicle Pain    HPI: Vincent [name] is a 63 y.o. male who presents today for right groin pain with interpreter, Vincent [name].   Previous records reviewed.   He had seen Dr. Cherylene Corrente for similar complaints in October 2024.  At that time, he had a negative abdominal CT and a negative scrotal ultrasound.  The CT scan did demonstrate some bladder wall thickening but he had no bothersome lower urinary tract symptoms or symptoms of UTI.  His urinalysis was clear at that visit and it was decided that his pain may be neuropathic in nature and he was undergoing physical therapy for his lower back pain.  The bladder wall thickening on CT was most likely secondary to BPH and since he was emptying his bladder well and not having any symptoms, no treatment was recommended.  He was to follow-up as needed.  Since then he seen his primary care physician with the same complaints.  Another CT scan was completed on March 20, 2023 demonstrated constipation.    He states for the last 2 years he has been having pain that starts in the right testicle radiates up to the right lower quadrant and into the right hip.  And then for the last year he has been having right mid abdominal pain.  He states the pain is constant at a dull ache and then sometimes it becomes very sharp.  He has issues with constipation, sometimes going as long as 5 days without having a bowel movement.  He also has never had a screening colonoscopy.    Patient denies any modifying or aggravating factors.  Patient denies any recent UTI's, gross hematuria, dysuria or suprapubic/flank pain.  Patient denies any  fevers, chills, nausea or vomiting.    He also has been having issues with erectile dysfunction.Vincent [name]  UA few bacteria.    PMH: Past Medical History:  Diagnosis Date   Patient denies medical problems     Surgical History: Past Surgical History:  Procedure Laterality Date   denies      Home Medications:  Allergies as of 04/25/2023   No Known Allergies      Medication List        Accurate as of April 25, 2023 10:52 AM. If you have any questions, ask your nurse or doctor.          STOP taking these medications    bismuth -metronidazole -tetracycline  140-125-125 MG capsule Commonly known as: PYLERA Stopped by: Calista Crain   hydrOXYzine  10 MG tablet Commonly known as: ATARAX  Stopped by: Dawan Farney   methocarbamol  500 MG tablet Commonly known as: ROBAXIN  Stopped by: Kadance Mccuistion   omeprazole  20 MG capsule Commonly known as: PRILOSEC Stopped by: Matilde Son       TAKE these medications    Accu-Chek Guide test strip Generic drug: glucose blood Use as instructed   Accu-Chek Softclix Lancets lancets Use as instructed   Blood Glucose Monitoring Suppl Devi 1 each by Does not apply route in the morning, at noon, and at bedtime. May substitute to any manufacturer covered by patient's insurance.   Accu-Chek Guide w/Device Kit See admin instructions.   gabapentin  100  MG capsule Commonly known as: NEURONTIN  Take 1 capsule (100 mg total) by mouth at bedtime.   metFORMIN  500 MG tablet Commonly known as: GLUCOPHAGE  Take 2 tablets (1,000 mg total) by mouth 2 (two) times daily with a meal.   polyethylene glycol powder 17 GM/SCOOP powder Commonly known as: GLYCOLAX /MIRALAX  Take 17 g by mouth 2 (two) times daily as needed.   senna 8.6 MG tablet Commonly known as: SENOKOT Take 1 tablet (8.6 mg total) by mouth 2 (two) times daily.        Allergies: No Known Allergies  Family History: History reviewed. No pertinent family  history.  Social History:  reports that he quit smoking about 11 years ago. His smoking use included cigarettes. He started smoking about 46 years ago. He has never used smokeless tobacco. He reports that he does not currently use alcohol. He reports that he does not use drugs.  ROS: Pertinent ROS in HPI  Physical Exam: BP 114/78   Pulse 81   Ht 5\' 7"  (1.702 m)   Wt 150 lb 1.6 oz (68.1 kg)   BMI 23.51 kg/m   Constitutional:  Well nourished. Alert and oriented, No acute distress. HEENT:  AT, moist mucus membranes.  Trachea midline Cardiovascular: No clubbing, cyanosis, or edema. Respiratory: Normal respiratory effort, no increased work of breathing. GU: No CVA tenderness.  No bladder fullness or masses.  Patient with circumcised phallus.   Urethral meatus is patent.  No penile discharge. No penile lesions or rashes. Scrotum without lesions, cysts, rashes and/or edema.  Testicles are located scrotally bilaterally. No masses are appreciated in the testicles. Left and right epididymis are normal. Neurologic: Grossly intact, no focal deficits, moving all 4 extremities. Psychiatric: Normal mood and affect.  Laboratory Data: Lab Results  Component Value Date   WBC 5.5 10/16/2022   HGB 14.2 10/16/2022   HCT 42.4 10/16/2022   MCV 87 10/16/2022   PLT 221 10/16/2022    Lab Results  Component Value Date   CREATININE 1.14 01/16/2023    Lab Results  Component Value Date   HGBA1C 7.6 (H) 01/16/2023    Lab Results  Component Value Date   TSH 3.160 10/16/2022    Lab Results  Component Value Date   AST 21 01/16/2023   Lab Results  Component Value Date   ALT 28 01/16/2023    Urinalysis See EPIC and HPI  I have reviewed the labs.   Pertinent Imaging: CLINICAL DATA:  Right groin pain.   EXAM: SCROTAL ULTRASOUND   DOPPLER ULTRASOUND OF THE TESTICLES   TECHNIQUE: Complete ultrasound examination of the testicles, epididymis, and other scrotal structures was performed.  Color and spectral Doppler ultrasound were also utilized to evaluate blood flow to the testicles.   COMPARISON:  None Available.   FINDINGS: Right testicle   Measurements: 3.5 x 1.6 x 2.6 cm. No mass or microlithiasis visualized.   Left testicle   Measurements: 3.1 x 1.3 x 2.6 cm. No mass or microlithiasis visualized.   Right epididymis:  Normal in size and appearance.   Left epididymis:  Normal in size and appearance.   Hydrocele:  None visualized.   Varicocele:  None visualized.   Pulsed Doppler interrogation of both testes demonstrates normal low resistance arterial and venous waveforms bilaterally.   IMPRESSION: Normal testicular ultrasound.     Electronically Signed   By: Tita Form M.D.   On: 10/24/2022 16:47   CLINICAL DATA:  Chronic abdominal pain.   EXAM: CT ABDOMEN AND  PELVIS WITHOUT CONTRAST   TECHNIQUE: Multidetector CT imaging of the abdomen and pelvis was performed following the standard protocol without IV contrast.   RADIATION DOSE REDUCTION: This exam was performed according to the departmental dose-optimization program which includes automated exposure control, adjustment of the mA and/or kV according to patient size and/or use of iterative reconstruction technique.   COMPARISON:  None Available.   FINDINGS: Lower chest: Lung bases clear.  No pleural or pericardial effusion.   Hepatobiliary: No focal liver abnormality is seen. No gallstones, gallbladder wall thickening, or biliary dilatation.   Pancreas: Unremarkable. No pancreatic ductal dilatation or surrounding inflammatory changes.   Spleen: Normal in size without focal abnormality.   Adrenals/Urinary Tract: Adrenal glands are unremarkable. Kidneys are normal, without renal calculi, focal lesion, or hydronephrosis. Urinary bladder wall thickening noted consistent with hypertrophy or inflammation.   Stomach/Bowel: Stomach is within normal limits. Appendix appears normal. No  evidence of bowel wall thickening, distention, or inflammatory changes.   Vascular/Lymphatic: No significant vascular findings are present. No enlarged abdominal or pelvic lymph nodes.   Reproductive: Prostate is unremarkable.   Other: No abdominal wall hernia or abnormality. No abdominopelvic ascites.   Musculoskeletal: No acute or significant osseous findings.   IMPRESSION: Urinary bladder wall thickening consistent with hypertrophy or inflammation. Otherwise no acute abdominal or pelvic pathology.     Electronically Signed   By: Sydell Eva M.D.   On: 12/08/2022 16:04   CLINICAL DATA:  Enlarged tender liver. Early satiety. Right-sided abdominal pain.   EXAM: CT ABDOMEN AND PELVIS WITHOUT CONTRAST   TECHNIQUE: Multidetector CT imaging of the abdomen and pelvis was performed following the standard protocol without IV contrast.   RADIATION DOSE REDUCTION: This exam was performed according to the departmental dose-optimization program which includes automated exposure control, adjustment of the mA and/or kV according to patient size and/or use of iterative reconstruction technique.   COMPARISON:  CT abdomen pelvis dated 12/01/2022.   FINDINGS: Evaluation of this exam is limited in the absence of intravenous contrast.   Lower chest: The visualized lung bases are clear.   No intra-abdominal free air or free fluid.   Hepatobiliary: The liver is unremarkable. No biliary dilatation. The gallbladder is unremarkable.   Pancreas: Unremarkable. No pancreatic ductal dilatation or surrounding inflammatory changes.   Spleen: Normal in size without focal abnormality.   Adrenals/Urinary Tract: The adrenal glands are remarkable. There is no hydronephrosis or nephrolithiasis on either side. There is a 15 mm right renal interpolar cyst, present on the prior CT. The visualized ureters and urinary bladder appear unremarkable.   Stomach/Bowel: There is moderate stool  throughout the colon. There is no bowel obstruction or active inflammation. There is a 2 cm duodenal diverticulum. The appendix is normal.   Vascular/Lymphatic: Mild atherosclerotic calcification of the left common iliac artery. The abdominal aorta and IVC are otherwise grossly unremarkable on this noncontrast CT. No portal venous gas. There is no adenopathy.   Reproductive: The prostate and seminal vesicles are grossly unremarkable. No pelvic mass.   Other: Small fat containing umbilical hernia.   Musculoskeletal: No acute or significant osseous findings.   IMPRESSION: 1. No acute intra-abdominal or pelvic pathology. 2. No hydronephrosis or nephrolithiasis. 3. Moderate colonic stool burden. No bowel obstruction. Normal appendix.     Electronically Signed   By: Angus Bark M.D.   On: 03/31/2023 21:14 I have independently reviewed the films.    Assessment & Plan:    1. Right groin pain -CT  x 2 w/o etiology for pain -UA negative  -urine culture pending -atypical culture pending -exam benign  2. BPH with LUTS -PSA pending -DRE not performed today as I did not want affect the PSA levels we will perform DRE when he returns  3. ED -Testosterone  level pending -He will return for appointment dedicated to discuss his erectile dysfunction  4.  Constipation -There has not been a urological cause identified for his symptoms.  He does complain of constipation and has not yet had a screening colonoscopy.  His pain may be GI in nature or it may be muscle skeletal in nature.  I have placed a referral to GI so he could hopefully get his screening colonoscopy and also get their opinion regarding his pain -If colonoscopy is performed and is negative and GI does not feel the pain is caused by GI issue, I would consider a muscle skeletal etiology  Return in about 3 weeks (around 05/16/2023) for SHIM and DRE .  These notes generated with voice recognition software. I apologize for  typographical errors.  Briant Camper  [facility] [address]  [address] [place], [place] [address] [phone]

## 2023-04-23 ENCOUNTER — Ambulatory Visit: Payer: Self-pay | Admitting: *Deleted

## 2023-04-23 NOTE — Telephone Encounter (Signed)
  Chief Complaint: Needed directions on how to take Miralax .   I let him know how to measure out the 17 Grams using the lid on the bottle in 4-8 oz. Of water or juice.   Mix it up and drink it as needed for constipation. Symptoms: N/A Frequency: N/A Pertinent Negatives: Patient denies N/A Disposition: [] ED /[] Urgent Care (no appt availability in office) / [] Appointment(In office/virtual)/ []  [facility]/ [x] Home Care/ [] Refused Recommended Disposition /[] [facility]/ []  Follow-up with PCP Additional Notes: He had questions regarding his wife's medications.   I used [facility] and returned his call Myrl [phone].  I closed this chart and opened her chart.

## 2023-04-23 NOTE — Telephone Encounter (Signed)
 Message from Victoria B sent at 04/23/2023 11:40 AM EST  Summary: directions on med   Pt called in, needs instructions on how to take, med, polyethylene glycol powder (GLYCOLAX /MIRALAX ) 17 GM/SCOOP powde          Call History  Contact Date/Time Type Contact Phone/Fax By  04/23/2023 11:35 AM EST Phone (Incoming) [place], Areli (Self) [phone] MIKE) Benton, Victoria M   Reason for Disposition  Caller has medicine question only, adult not sick, AND triager answers question  Answer Assessment - Initial Assessment Questions 1. NAME of MEDICINE: What medicine(s) are you calling about?     Miralax  17GM /scoop Powder 2. QUESTION: What is your question? (e.g., double dose of medicine, side effect)     How do I take this?   I read the directions to him on how to mix and take the Miralax .  My wife was prescribed medication 2 months ago.  I did not have the money to pay for it then.   I have the money now.   Can the dr. Furman the medicine again to the pharmacy? 3. PRESCRIBER: Who prescribed the medicine? Reason: if prescribed by specialist, call should be referred to that group.     Dr. Lang 4. SYMPTOMS: Do you have any symptoms? If Yes, ask: What symptoms are you having?  How bad are the symptoms (e.g., mild, moderate, severe)     It's for constipation 5. PREGNANCY:  Is there any chance that you are pregnant? When was your last menstrual period?     N/A  Protocols used: Medication Question Call-A-AH

## 2023-04-25 ENCOUNTER — Encounter: Payer: Self-pay | Admitting: Urology

## 2023-04-25 ENCOUNTER — Ambulatory Visit (INDEPENDENT_AMBULATORY_CARE_PROVIDER_SITE_OTHER): Payer: Medicaid Other | Admitting: Urology

## 2023-04-25 VITALS — BP 114/78 | HR 81 | Ht 67.0 in | Wt 150.1 lb

## 2023-04-25 DIAGNOSIS — N138 Other obstructive and reflux uropathy: Secondary | ICD-10-CM

## 2023-04-25 DIAGNOSIS — N528 Other male erectile dysfunction: Secondary | ICD-10-CM | POA: Diagnosis not present

## 2023-04-25 DIAGNOSIS — N401 Enlarged prostate with lower urinary tract symptoms: Secondary | ICD-10-CM

## 2023-04-25 DIAGNOSIS — R1030 Lower abdominal pain, unspecified: Secondary | ICD-10-CM | POA: Diagnosis not present

## 2023-04-25 DIAGNOSIS — K5909 Other constipation: Secondary | ICD-10-CM | POA: Diagnosis not present

## 2023-04-25 LAB — URINALYSIS, COMPLETE
Bilirubin, UA: NEGATIVE
Ketones, UA: NEGATIVE
Leukocytes,UA: NEGATIVE
Nitrite, UA: NEGATIVE
RBC, UA: NEGATIVE
Specific Gravity, UA: 1.02 (ref 1.005–1.030)
Urobilinogen, Ur: 1 mg/dL (ref 0.2–1.0)
pH, UA: 6 (ref 5.0–7.5)

## 2023-04-25 LAB — MICROSCOPIC EXAMINATION: Epithelial Cells (non renal): >10 /[HPF] — AB (ref 0–10)

## 2023-04-26 LAB — TESTOSTERONE: Testosterone: 594 ng/dL (ref 264–916)

## 2023-04-26 LAB — PSA: Prostate Specific Ag, Serum: 1.3 ng/mL (ref 0.0–4.0)

## 2023-04-28 LAB — CULTURE, URINE COMPREHENSIVE

## 2023-05-01 ENCOUNTER — Telehealth: Payer: Self-pay | Admitting: Pharmacist

## 2023-05-01 DIAGNOSIS — Z758 Other problems related to medical facilities and other health care: Secondary | ICD-10-CM

## 2023-05-01 DIAGNOSIS — E119 Type 2 diabetes mellitus without complications: Secondary | ICD-10-CM

## 2023-05-01 MED ORDER — JANUMET 50-1000 MG PO TABS: 1.0000 | ORAL_TABLET | Freq: Two times a day (BID) | ORAL | 3 refills | Status: DC

## 2023-05-01 MED ORDER — JANUMET XR 50-1000 MG PO TB24: 1.0000 | ORAL_TABLET | Freq: Two times a day (BID) | ORAL | 3 refills | Status: DC

## 2023-05-01 NOTE — Telephone Encounter (Signed)
Will consult with pharmacist to start pt on janumet

## 2023-05-01 NOTE — Progress Notes (Signed)
   05/01/2023  Patient ID: Vincent [name], male   DOB: [birth year], 63 y.o.   MRN: [id number]  Will contact pharmacy regarding Janumet XR 50-1000mg  twice a day. Sent in Rx today after [employer] PA was denied.  Will see about getting 90DS for coverage for a $4 copay with insurance and verify that no PA is needed.  Will need to cancel Metformin Rx as well to avoid confusion!!  Called pharmacy and a 90DS of medication has been set to order. Will call in 2 days to see if filled and cost associated. Will cancel Metformin Rx then!!  Update from 05/03/23: - Called patient this morning regarding Janumet and how to take it, as well as the doxycyline picked up yesterday - Advised the doxycycline is an antibiotic for the infection they saw on the urine culture- told to take with food twice a day - Reports having pain and history of hernia prior that caused his testicles to swell and wanted to know if this was the cause- said he took a medication and problem resolved in a few days - Advised to discuss at Urology visit on 05/08/22 and hoping antibiotic helps - As for the Metformin, cancelled Rx at the pharmacy and advised him to STOP Metformin and only take the Janumet instead- confirmed understanding - Asked about social worker due to living in a home with 6 people and only 1 child's income supporting the house payments; have $400 in food stamps which is not enough  - Provided him with phone number of social worker Gus Puma who tried to reach his wife previously and also sent her a message requesting she try to call them again  - Also brought up concerns regarding wife which are documented in her profile     Marlowe Aschoff, PharmD [facility] Phone Number: [phone]

## 2023-05-02 ENCOUNTER — Telehealth: Payer: Self-pay | Admitting: *Deleted

## 2023-05-02 LAB — MYCOPLASMA / UREAPLASMA CULTURE
Mycoplasma hominis Culture: NEGATIVE
Ureaplasma urealyticum: POSITIVE — AB

## 2023-05-02 MED ORDER — DOXYCYCLINE HYCLATE 100 MG PO TABS: 100.0000 mg | ORAL_TABLET | Freq: Two times a day (BID) | ORAL | 0 refills | Status: DC

## 2023-05-02 NOTE — Telephone Encounter (Signed)
-----   Message from [facility] sent at 05/02/2023  9:53 AM EST ----- Please let Mr. Werthmann know that his atypical culture was positive and he needs to start doxycycline 100 mg twice daily for 10 days.

## 2023-05-02 NOTE — Progress Notes (Signed)
Celso Amy, PA-C [address]  [address]  [place], [place] [address]  Main: [phone]  Fax: [phone]   Gastroenterology Consultation  Referring Provider:     Brett Albino* Primary Care Physician:  Ronnald Ramp, MD Primary Gastroenterologist:  Celso Amy, PA-C / Dr. Wyline Mood   Reason for Consultation:     Right Side Pain, Constipation, H. pylori infection        HPI:   Vincent [name] is a 63 y.o. y/o male referred for consultation & management  by Ronnald Ramp, MD.    We are using Swahili interpreter: Vincent [name] [phone].  Patient states he has been having right-sided abdominal pain in the right lower quadrant right mid abdomen and right upper quadrant for 2 years.  Worse for the past few months.  He is having right testicular pain which radiates up into the right lower quadrant around to his right low back and up into the right upper quadrant.  Chronic pain for 1 or 2 years.  Currently seeing urologist and had a normal testicular ultrasound.  Has recently been prescribed antibiotic doxycycline for UTI.  Patient admits to constipation.  Is taking MiraLAX and Senokot with some benefit.  Denies rectal bleeding or weight loss.  03/2023 CT abdomen and pelvis without contrast: No acute abnormality.  No kidney stones.  Moderate stool burden consistent with constipation.  No bowel obstruction.  No masses.  02/2023 H. pylori breath test was positive.  Treated with omeprazole 20 Mg twice daily, bismuth subsalicylate 525 mg 4 times daily, tetracycline 500 mg 4 times daily, metronidazole 500 Mg 3 times daily x 14 days.  (Quadruple therapy).  Labs 10/2022 normal CBC with hemoglobin 14.2.  Normal TSH.  A1c 9.0. Lab 01/2023 elevated glucose 189, otherwise normal CMP and normal LFTs.  No previous EGD, colonoscopy, or GI evaluation.   Past Medical History:  Diagnosis Date   Chronic bilateral low back pain 12/05/2022   Diabetes mellitus  without complication (HCC) 11/20/2022   Groin pain, right 10/13/2022   H. 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 Authorizing Provider  ACCU-CHEK GUIDE test strip Use as instructed 02/13/23   Simmons-Robinson, Tawanna Cooler, MD  Accu-Chek Softclix Lancets lancets Use as instructed 02/13/23   Simmons-Robinson, Tawanna Cooler, MD  Blood Glucose Monitoring Suppl (ACCU-CHEK GUIDE) w/Device KIT See admin instructions. 12/13/22   [provider]  Blood Glucose Monitoring Suppl DEVI 1 each by Does not apply route in the morning, at noon, and at bedtime. May substitute to any manufacturer covered by patient's insurance. 12/04/22   Debera Lat, PA-C  gabapentin (NEURONTIN) 100 MG capsule Take 1 capsule (100 mg total) by mouth at bedtime. 01/16/23   Simmons-Robinson, Tawanna Cooler, MD  metFORMIN (GLUCOPHAGE) 500 MG tablet Take 2 tablets (1,000 mg total) by mouth 2 (two) times daily with a meal. 03/15/23   Simmons-Robinson, [name], MD  polyethylene glycol powder (GLYCOLAX/MIRALAX) 17 GM/SCOOP powder Take 17 g by mouth 2 (two) times daily as needed. 04/02/23   Simmons-Robinson, Tawanna Cooler, MD  senna (SENOKOT) 8.6 MG tablet  Take 1 tablet (8.6 mg total) by mouth 2 (two) times daily. 04/02/23   Simmons-Robinson, Tawanna Cooler, MD  SitaGLIPtin-MetFORMIN HCl (JANUMET XR) 50-1000 MG TB24 Take 1 tablet by mouth in the morning and at bedtime. 05/01/23   Simmons-Robinson, Tawanna Cooler, MD    History reviewed. No pertinent family history.   Social History   Tobacco Use   Smoking status: Former    Current  packs/day: 0.00    Types: Cigarettes    Start date: 1979    Quit date: 2014    Years since quitting: 11.0   Smokeless tobacco: Never  Vaping Use   Vaping status: Never Used  Substance Use Topics   Alcohol use: Not Currently    [place]   Drug use: Never    Allergies as of 05/03/2023   (No Known Allergies)    Review of Systems:    All systems reviewed and negative except where noted in HPI.   Physical Exam:  BP 119/79   Pulse 76   Temp 97.6 F (36.4 C)   Ht 5\' 7"  (1.702 m)   Wt 150 lb 9.6 oz (68.3 kg)   BMI 23.59 kg/m  No LMP for male patient.  General:   Alert,  Well-developed, well-nourished, pleasant and cooperative in NAD Lungs:  Respirations even and unlabored.  Clear throughout to auscultation.   No wheezes, crackles, or rhonchi. No acute distress. Heart:  Regular rate and rhythm; no murmurs, clicks, rubs, or gallops. Abdomen:  Normal bowel sounds.  No bruits.  Soft, and non-distended without masses, hepatosplenomegaly or hernias noted.  No Tenderness.  No guarding or rebound tenderness.    Neurologic:  Alert and oriented x3;  grossly normal neurologically. Psych:  Alert and cooperative. Normal mood and affect.  Imaging Studies: No results found.  Assessment and Plan:   Vincent [name] is a 63 y.o. y/o male has been referred for   1.  Abdominal pain - Right Sided; Chronic; Uncertain Etiology; abdominal pelvic CT showed constipation, otherwise unrevealing.  2.  Constipation  Continue MiraLAX and Senokot daily  3.  History of H. pylori infection 02/2023 treated with quadruple therapy.  Repeat H. pylori breath test  Scheduling EGD I discussed risks of EGD with patient to include risk of bleeding, perforation, and risk of sedation.  Patient expressed understanding and agrees to proceed with EGD.   4.  Colon cancer screening  Scheduling Colonoscopy I discussed risks of colonoscopy with patient to include risk of  bleeding, colon perforation, and risk of sedation.  Patient expressed understanding and agrees to proceed with colonoscopy.   5. Left Testicular and Left Inguinal Pain; chronic for a year  F/U with Urology  He had a normal testicular ultrasound by urology.  Follow up 4 weeks after EGD and colonoscopy.  Celso Amy, PA-C

## 2023-05-02 NOTE — Telephone Encounter (Signed)
Called placed with interpreter that UA was positive and rx sent to [facility] [address]  rx sent to pharmacy by e-script  Pt also asking about PSA, and testosterone results

## 2023-05-03 ENCOUNTER — Ambulatory Visit: Payer: Medicaid Other | Admitting: Physician Assistant

## 2023-05-03 ENCOUNTER — Encounter: Payer: Self-pay | Admitting: Physician Assistant

## 2023-05-03 VITALS — BP 119/79 | HR 76 | Temp 97.6°F | Ht 67.0 in | Wt 150.6 lb

## 2023-05-03 DIAGNOSIS — K5900 Constipation, unspecified: Secondary | ICD-10-CM | POA: Diagnosis not present

## 2023-05-03 DIAGNOSIS — R1084 Generalized abdominal pain: Secondary | ICD-10-CM

## 2023-05-03 DIAGNOSIS — N5089 Other specified disorders of the male genital organs: Secondary | ICD-10-CM | POA: Diagnosis not present

## 2023-05-03 DIAGNOSIS — R1013 Epigastric pain: Secondary | ICD-10-CM

## 2023-05-03 DIAGNOSIS — Z8619 Personal history of other infectious and parasitic diseases: Secondary | ICD-10-CM

## 2023-05-03 DIAGNOSIS — R109 Unspecified abdominal pain: Secondary | ICD-10-CM | POA: Diagnosis not present

## 2023-05-03 DIAGNOSIS — Z1211 Encounter for screening for malignant neoplasm of colon: Secondary | ICD-10-CM

## 2023-05-03 DIAGNOSIS — K5901 Slow transit constipation: Secondary | ICD-10-CM

## 2023-05-03 MED ORDER — PEG 3350-KCL-NA BICARB-NACL 420 G PO SOLR: 4000.0000 mL | Freq: Once | ORAL | 0 refills | Status: AC

## 2023-05-03 NOTE — Addendum Note (Signed)
Addended by: Pollie Friar on: 05/03/2023 11:55 AM   Modules accepted: Orders

## 2023-05-03 NOTE — Addendum Note (Signed)
Addended by: Pollie Friar on: 05/03/2023 12:19 PM   Modules accepted: Orders

## 2023-05-04 ENCOUNTER — Other Ambulatory Visit: Payer: Self-pay | Admitting: Pharmacist

## 2023-05-04 LAB — H. PYLORI BREATH TEST: H pylori Breath Test: POSITIVE — AB

## 2023-05-07 NOTE — Progress Notes (Unsigned)
05/09/2023 9:30 PM   Vincent [name] [birth year] [id number]  Referring provider: Ronnald Ramp, MD [address] [place],  [place] [address]  Urological history: 1. BPH with LU TS  -PSA (04/2023) 1.3  2. Renal cyst -CT (03/2023) - 15 mm right renal cyst   No chief complaint on file.  HPI: Vincent [name] is a 63 y.o. male who presents today for right groin pain with interpreter, Vincent [name].   Previous records reviewed.   At his visit on 04/25/2023, he had seen Dr. Lonna Cobb for similar complaints in October 2024.  At that time, he had a negative abdominal CT and a negative scrotal ultrasound.  The CT scan did demonstrate some bladder wall thickening but he had no bothersome lower urinary tract symptoms or symptoms of UTI.  His urinalysis was clear at that visit and it was decided that his pain may be neuropathic in nature and he was undergoing physical therapy for his lower back pain.  The bladder wall thickening on CT was most likely secondary to BPH and since he was emptying his bladder well and not having any symptoms, no treatment was recommended.  He was to follow-up as needed.  Since then he seen his primary care physician with the same complaints.  Another CT scan was completed on March 20, 2023 demonstrated constipation.   He states for the last 2 years he has been having pain that starts in the right testicle radiates up to the right lower quadrant and into the right hip.  And then for the last year he has been having right mid abdominal pain.  He states the pain is constant at a dull ache and then sometimes it becomes very sharp.  He has issues with constipation, sometimes going as long as 5 days without having a bowel movement.  He also has never had a screening colonoscopy.  Patient denies any modifying or aggravating factors.  Patient denies any recent UTI's, gross hematuria, dysuria or suprapubic/flank pain.  Patient denies any fevers, chills,  nausea or vomiting.   He also has been having issues with erectile dysfunction.  UA few bacteria.   Atypical culture positive for ureaplasma.   Urine culture is negative.  A testosterone level and PSA were drawn.    PSA was 1.3 and testosterone level was 594.   He has seen his PCP and is scheduled for EGD and colonoscopy.   SHIM ***    Score: 1-7 Severe ED 8-11 Moderate ED 12-16 Mild-Moderate ED 17-21 Mild ED 22-25 No ED   I PSS ***    Score:  1-7 Mild 8-19 Moderate 20-35 Severe      PMH: Past Medical History:  Diagnosis Date   Chronic bilateral low back pain 12/05/2022   Diabetes mellitus without complication (HCC) 11/20/2022   Groin pain, right 10/13/2022   H. 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 If you  have any questions, ask your nurse or doctor.          Accu-Chek Guide test strip Generic drug: glucose blood Use as instructed   Accu-Chek Softclix Lancets lancets Use as instructed   Blood Glucose Monitoring Suppl Devi 1 each by Does not apply route in the morning, at noon, and at bedtime. May substitute to any manufacturer covered by patient's insurance.   Accu-Chek Guide w/Device Kit See admin instructions.   doxycycline 100 MG tablet Commonly known as: VIBRA-TABS Take 1 tablet (100 mg total) by mouth 2 (two) times daily for 7 days.   gabapentin 100 MG capsule Commonly known as: NEURONTIN Take 1 capsule (100 mg total) by mouth  at bedtime.   Janumet XR 50-1000 MG Tb24 Generic drug: SitaGLIPtin-MetFORMIN HCl Take 1 tablet by mouth in the morning and at bedtime.   polyethylene glycol powder 17 GM/SCOOP powder Commonly known as: GLYCOLAX/MIRALAX Take 17 g by mouth 2 (two) times daily as needed.   senna 8.6 MG tablet Commonly known as: SENOKOT Take 1 tablet (8.6 mg total) by mouth 2 (two) times daily.        Allergies: No Known Allergies  Family History: No family history on file.  Social History:  reports that he quit smoking about 11 years ago. His smoking use included cigarettes. He started smoking about 46 years ago. He has never used smokeless tobacco. He reports that he does not currently use alcohol. He reports that he does not use drugs.  ROS: Pertinent ROS in HPI  Physical Exam: There were no vitals taken for this visit.  Constitutional:  Well nourished. Alert and oriented, No acute distress. HEENT: [place] AT, moist mucus membranes.  Trachea midline, no masses. Cardiovascular: No clubbing, cyanosis, or edema. Respiratory: Normal respiratory effort, no increased work of breathing. GI: Abdomen is soft, non tender, non distended, no abdominal masses. Liver and spleen not palpable.  No hernias appreciated.  Stool sample for occult testing is not indicated.   GU: No CVA tenderness.  No bladder fullness or masses.  Patient with circumcised/uncircumcised phallus. ***Foreskin easily retracted***  Urethral meatus is patent.  No penile discharge. No penile lesions or rashes. Scrotum without lesions, cysts, rashes and/or edema.  Testicles are located scrotally bilaterally. No masses are appreciated in the testicles. Left and right epididymis are normal. Rectal: Patient with  normal sphincter tone. Anus and perineum without scarring or rashes. No rectal masses are appreciated. Prostate is approximately *** grams, *** nodules are appreciated. Seminal vesicles are normal. Skin: No rashes, bruises or suspicious  lesions. Lymph: No cervical or inguinal adenopathy. Neurologic: Grossly intact, no focal deficits, moving all 4 extremities. Psychiatric: Normal mood and affect.   Laboratory Data: N/A   Pertinent Imaging: N/A  Assessment & Plan:    1. Right groin pain -CT x 2 w/o etiology for pain -UA negative  -urine culture pending -atypical culture pending -exam benign  2. BPH with LUTS -PSA normal  -DRE not performed today as I did not want affect the PSA levels we will perform DRE when he returns  3. ED -Testosterone level normal - I explained to the patient that in order to achieve an erection it takes good functioning of the nervous system (parasympathetic and rs, sympathetic, sensory and motor), good blood flow into the erectile tissue of the penis and a desire to have sex - I explained that conditions like diabetes, hypertension, coronary artery disease, peripheral vascular disease, smoking, alcohol consumption, age, sleep apnea and BPH  can diminish the ability to have an erection - I explained the ED may be a risk marker for underlying CVD and he should follow up with PCP for further studies *** - A recent study published in Sex Med 2018 Apr 13 revealed moderate to vigorous aerobic exercise for 40 minutes 4 times per week can decrease erectile problems caused by physical inactivity, obesity, hypertension, metabolic syndrome and/or cardiovascular diseases *** - We discussed trying a *** different PDE5 inhibitor, intra-urethral suppositories, intracavernous vasoactive drug injection therapy, vacuum erection devices, LI-ESWT and penile prosthesis implantation   4.  Constipation -Scheduled for GI studies  No follow-ups on file.  These notes generated with voice recognition software. I apologize for typographical errors.  Cloretta Ned  [facility] [address]  [address] [place], [place] [address] [phone]

## 2023-05-08 ENCOUNTER — Telehealth: Payer: Self-pay

## 2023-05-08 ENCOUNTER — Other Ambulatory Visit: Payer: Self-pay | Admitting: Physician Assistant

## 2023-05-08 DIAGNOSIS — A048 Other specified bacterial intestinal infections: Secondary | ICD-10-CM

## 2023-05-08 MED ORDER — AMOXICILLIN 875 MG PO TABS: 875.0000 mg | ORAL_TABLET | Freq: Three times a day (TID) | ORAL | 0 refills | Status: AC

## 2023-05-08 MED ORDER — LEVOFLOXACIN 500 MG PO TABS: 500.0000 mg | ORAL_TABLET | Freq: Every day | ORAL | 0 refills | Status: AC

## 2023-05-08 MED ORDER — PANTOPRAZOLE SODIUM 40 MG PO TBEC: 40.0000 mg | DELAYED_RELEASE_TABLET | Freq: Two times a day (BID) | ORAL | 0 refills | Status: DC

## 2023-05-08 NOTE — Telephone Encounter (Signed)
Spoke with interpreter and patient verbalized understanding.    Call and notify patient H. pylori breath test is positive again.   He needs Swahili language interpreter.   He needs to take more treatment for H. Pylori Infection:  1.  Rx pantoprazole 40 Mg 1 tablet twice daily x 14 days  2.  Rx levofloxacin 500 mg 1 tablet once daily x 14 days  3.  Amoxicillin 750 Mg 1 tablet 3 times daily x 14 days.  I sent all prescriptions to [facility].   Continue with plan for EGD and colonoscopy as scheduled.   Celso Amy, PA-C

## 2023-05-09 ENCOUNTER — Ambulatory Visit (INDEPENDENT_AMBULATORY_CARE_PROVIDER_SITE_OTHER): Payer: Medicaid Other | Admitting: Urology

## 2023-05-09 ENCOUNTER — Encounter: Payer: Self-pay | Admitting: Urology

## 2023-05-09 VITALS — BP 110/78 | HR 91

## 2023-05-09 DIAGNOSIS — N401 Enlarged prostate with lower urinary tract symptoms: Secondary | ICD-10-CM

## 2023-05-09 DIAGNOSIS — R1030 Lower abdominal pain, unspecified: Secondary | ICD-10-CM | POA: Diagnosis not present

## 2023-05-09 DIAGNOSIS — N528 Other male erectile dysfunction: Secondary | ICD-10-CM

## 2023-05-09 MED ORDER — SILDENAFIL CITRATE 100 MG PO TABS: 100.0000 mg | ORAL_TABLET | Freq: Every day | ORAL | 0 refills | Status: DC | PRN

## 2023-05-09 NOTE — Patient Instructions (Addendum)
Viagra (sildenafil) 100 mg.  Do not use with nitrates.

## 2023-05-15 ENCOUNTER — Telehealth: Payer: Self-pay | Admitting: Urology

## 2023-05-15 ENCOUNTER — Telehealth: Payer: Self-pay | Admitting: *Deleted

## 2023-05-15 NOTE — Progress Notes (Signed)
 Complex Care Management Note Care Guide Note  05/15/2023 Name: Vincent [name] MRN: [id number] DOB: 06/30/1960   Complex Care Management Outreach Attempts: An unsuccessful telephone outreach was attempted today to offer the patient information about available complex care management services.  Follow Up Plan:  Additional outreach attempts will be made to offer the patient complex care management information and services.  No interpretor available  Encounter Outcome:  No Answer  [facility] Health[facility] Health Care Guide  Direct Dial:[phone] Fax:[phone] Website: [email or web address]

## 2023-05-15 NOTE — Telephone Encounter (Signed)
 This morning I called patient via Swahili interpreter to advise him of transportation arrangements for his 06/06/23 appointment with [facility]. During the call he asked the interpreter if his prescription had been sent to pharmacy. I told him it was sent to [facility] on 05/09/23. He said that his medicaid would not cover it, but that he would pay cash because he wants the medication. I did ask if he wanted to see if another medication would be covered, and he said no.

## 2023-05-16 ENCOUNTER — Telehealth: Payer: Self-pay | Admitting: *Deleted

## 2023-05-16 NOTE — Progress Notes (Signed)
 Complex Care Management Note Care Guide Note  05/16/2023 Name: Goku Harb MRN: [id number] DOB: [birth year]   Complex Care Management Outreach Attempts: A second unsuccessful outreach was attempted today to offer the patient with information about available complex care management services.  Follow Up Plan:  Additional outreach attempts will be made to offer the patient complex care management information and services.   Encounter Outcome:  No Answer  [facility]Population Health Care Guide  Direct Dial:[phone] [phone] Website: [email or web address]

## 2023-05-17 ENCOUNTER — Telehealth: Payer: Self-pay | Admitting: *Deleted

## 2023-05-17 NOTE — Progress Notes (Signed)
 Complex Care Management Note Care Guide Note  05/17/2023 Name: Vincent [name] MRN: [id number] DOB: 1961/01/14   Complex Care Management Outreach Attempts: A second unsuccessful outreach was attempted today to offer the patient with information about available complex care management services.  Follow Up Plan:  Additional outreach attempts will be made to offer the patient complex care management information and services.   Encounter Outcome:  No Answer  [facility] HealthPopulation Health Care Guide  Direct [phone] Fax:[phone] Website: [email or web address]

## 2023-05-18 ENCOUNTER — Ambulatory Visit: Payer: Medicaid Other | Admitting: Dietician

## 2023-05-21 ENCOUNTER — Telehealth: Payer: Self-pay | Admitting: *Deleted

## 2023-05-21 ENCOUNTER — Telehealth: Payer: Self-pay

## 2023-05-21 NOTE — Progress Notes (Signed)
 Complex Care Management Note Care Guide Note  05/21/2023 Name: Vincent [name] MRN: [id number] DOB: May 10, 1960  [name] is a 63 y.o. year old male who is a primary care patient of Simmons-Robinson, Financial controller, MD . The community resource team was consulted for assistance with Transportation Needs  ,   Housing   SDOH screenings and interventions completed:  Yes  Social Drivers of Health From This Encounter   Food Insecurity: Food Insecurity Present (05/21/2023)   Hunger Vital Sign    Worried About Running Out of Food in the Last Year: Often true  Housing: Unknown (05/21/2023)   Housing Stability Vital Sign    Unable to Pay for Housing in the Last Year: No    Number of Times Moved in the Last Year: 0    Homeless in the Last Year: Patient unable to answer  Financial Resource Strain: Medium Risk (05/21/2023)   Overall Financial Resource Strain (CARDIA)    Difficulty of Paying Living Expenses: Somewhat hard  Transportation Needs: No Transportation Needs (05/21/2023)   PRAPARE - Transportation    Lack of Transportation (Medical): No    Lack of Transportation (Non-Medical): No  Utilities: Not At Risk (05/21/2023)   Utilities    Threatened with loss of utilities: No    SDOH Interventions Today    Flowsheet Row Most Recent Value  SDOH Interventions   Food Insecurity Interventions NCCARE360 Referral, Community Resources Provided  [food stamps]  Housing Interventions Other (Comment)  [Lot of people living htere but food is hard and will apply for crisis electric will mail food banks and application for LIEAP]  Transportation Interventions SCAT (Specialized Community Area Transporation), [employer] Resources Provided  Utilities Interventions Other (Comment)  [refered LIEAP program]  Financial Strain Interventions [employer] Resources Provided  [employer] banks and a referral to [employer] and also mailed information]      Food banks and a referral to faithaction and also mailed information    Care guide performed the following interventions: Patient provided with information about care guide support team and interviewed to confirm resource needs.  Follow Up Plan:  No further follow up planned at this time. The patient has been provided with needed resources.  Encounter Outcome:  Patient Visit Completed Catelynn Sparger Greenauer-Moran  [facility] Health Care Guide  Direct Dial:[phone] Fax:[phone] Website: [email or web address]

## 2023-05-21 NOTE — Telephone Encounter (Signed)
 Per phone call  no prior auth required  [id number]

## 2023-05-23 ENCOUNTER — Ambulatory Visit: Admission: RE | Admit: 2023-05-23 | Payer: Medicaid Other | Source: Home / Self Care | Admitting: Gastroenterology

## 2023-05-23 ENCOUNTER — Encounter: Admission: RE | Payer: Self-pay | Source: Home / Self Care

## 2023-05-23 SURGERY — COLONOSCOPY WITH PROPOFOL
Anesthesia: General

## 2023-05-24 ENCOUNTER — Other Ambulatory Visit: Payer: Self-pay | Admitting: Pharmacist

## 2023-05-24 ENCOUNTER — Telehealth: Payer: Self-pay

## 2023-05-24 NOTE — Telephone Encounter (Signed)
Patient left a message with interpreter service and is calling to reschedule his colonoscopy

## 2023-05-24 NOTE — Progress Notes (Signed)
   05/24/2023  Patient ID: Vincent [name], male   DOB: [birth year], 63 y.o.   MRN: [id number]  Called and spoke with the patient regarding medications. Called to follow-up on Janumet tolerability. Doing well.   Appears patient also started triple therapy due to positive H pylori on 05/08/23. Shows medication pick-up for Levofloxacin, Pantoprazole, Amoxicillin. Will ask about this tolerability as well- doing well on these too.   Ask why colonoscopy missed yesterday. Reports confusion because he thought it was just an office visit and nobody called with more information regarding the colonoscopy. Reports he still never got his wife's colonoscopy re-scheduled either.  Called GI office with patient on the phone and left voicemail to return call to patient to schedule colonoscopy again for both him and his wife. Thanked for the assistance and has their phone number to call as well. Advised to request Swahili interpreter when they pick-up.   No follow-up regarding medications needed at this time.    Marlowe Aschoff, PharmD [facility] Phone Number: [phone]

## 2023-05-24 NOTE — Telephone Encounter (Signed)
Tried calling patient back to Reschedule EGD and Colonoscopy with interpreter services and no one would say anything- patient no showed for procedures.

## 2023-05-31 ENCOUNTER — Telehealth: Payer: Self-pay | Admitting: Gastroenterology

## 2023-05-31 NOTE — Telephone Encounter (Signed)
 The patient called in to reschedule his EGD but needs an interpreter because he speak Sw[place]. I called the interpreter and spoke with [facility] id#: [id number]. I transfer the call to the nurse.

## 2023-06-01 ENCOUNTER — Ambulatory Visit: Payer: Medicaid Other | Admitting: Dietician

## 2023-06-01 ENCOUNTER — Other Ambulatory Visit: Payer: Self-pay

## 2023-06-01 DIAGNOSIS — Z8619 Personal history of other infectious and parasitic diseases: Secondary | ICD-10-CM

## 2023-06-01 DIAGNOSIS — Z1211 Encounter for screening for malignant neoplasm of colon: Secondary | ICD-10-CM

## 2023-06-01 NOTE — Telephone Encounter (Signed)
 Called the interpreter line and patient agreed on having it done on 06/27/2023 with DR. Anna.

## 2023-06-04 ENCOUNTER — Telehealth: Payer: Self-pay

## 2023-06-04 NOTE — Telephone Encounter (Signed)
 Per pt will call me on Monday with update information.

## 2023-06-04 NOTE — Telephone Encounter (Signed)
 Per [employer] pt will no longer have insurance as of 06-08-2023. I have call pt with interpreter to ask if pt will have insurance after 06-08-2023. Per pt he will email me with new insurance information before March 19,2025

## 2023-06-05 NOTE — Progress Notes (Unsigned)
 06/06/2023 8:37 AM   Vincent [name] 10/01/1960 [id number]  Referring provider: Ronnald Ramp, MD [address] [place],  [place] [address]  Urological history: 1. BPH with LU TS  -PSA (04/2023) 1.3  2. Renal cyst -CT (03/2023) - 15 mm right renal cyst   No chief complaint on file.  HPI: Vincent [name] is a 63 y.o. male who presents today for follow with interpreter,   Previous records reviewed.   At his visit on 04/25/2023, he had seen Dr. Lonna Cobb for similar complaints in October 2024.  At that time, he had a negative abdominal CT and a negative scrotal ultrasound.  The CT scan did demonstrate some bladder wall thickening but he had no bothersome lower urinary tract symptoms or symptoms of UTI.  His urinalysis was clear at that visit and it was decided that his pain may be neuropathic in nature and he was undergoing physical therapy for his lower back pain.  The bladder wall thickening on CT was most likely secondary to BPH and since he was emptying his bladder well and not having any symptoms, no treatment was recommended.  He was to follow-up as needed.  Since then he seen his primary care physician with the same complaints.  Another CT scan was completed on March 20, 2023 demonstrated constipation.   He states for the last 2 years he has been having pain that starts in the right testicle radiates up to the right lower quadrant and into the right hip.  And then for the last year he has been having right mid abdominal pain.  He states the pain is constant at a dull ache and then sometimes it becomes very sharp.  He has issues with constipation, sometimes going as long as 5 days without having a bowel movement.  He also has never had a screening colonoscopy.  Patient denies any modifying or aggravating factors.  Patient denies any recent UTI's, gross hematuria, dysuria or suprapubic/flank pain.  Patient denies any fevers, chills, nausea or vomiting.    He also has been having issues with erectile dysfunction.  UA few bacteria.   Atypical culture positive for ureaplasma.   Urine culture is negative.  A testosterone level and PSA were drawn.    At his visit on 05/09/2023, PSA was 1.3 and testosterone level was 594.   He has seen his PCP and is scheduled for EGD and colonoscopy.   He is a difficult historian.   He states he has been having issues erectile dysfunction for the last 5 years.  He states he is unable to get an erection at all.  He denied any pain with erections or curvature with erections.  He states he has not tried any medication for the erections.  He is given a prescription for sildenafil 100 mg, on-demand dosing.   His urine was positive for Ureaplasma him and he is placed on doxycycline.  PMH: Past Medical History:  Diagnosis Date   Chronic bilateral low back pain 12/05/2022   Diabetes mellitus without complication (HCC) 11/20/2022   Groin pain, right 10/13/2022   H. 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 If you have any questions, ask your nurse or doctor.          Accu-Chek Guide test strip Generic drug: glucose blood Use as instructed   Accu-Chek Softclix Lancets lancets Use as instructed   Blood Glucose Monitoring Suppl Devi 1 each by Does not apply route in the morning, at noon, and at bedtime. May substitute to any manufacturer covered by patient's insurance.   Accu-Chek Guide w/Device  Kit See admin instructions.   gabapentin 100 MG capsule Commonly known as: NEURONTIN Take 1 capsule (100 mg total) by mouth at bedtime.   Janumet XR 50-1000 MG Tb24 Generic drug: SitaGLIPtin-MetFORMIN HCl Take 1 tablet by mouth in the morning and at bedtime.   pantoprazole 40 MG tablet Commonly known as: PROTONIX Take 1 tablet (40 mg total) by mouth 2 (two) times daily for 14 days.   polyethylene glycol powder 17 GM/SCOOP powder Commonly known as: GLYCOLAX/MIRALAX Take 17 g by mouth 2 (two) times daily as needed.   senna 8.6 MG tablet Commonly known as: SENOKOT Take 1 tablet (8.6 mg total) by mouth 2 (two) times daily.   sildenafil 100 MG tablet Commonly known as: VIAGRA Take 1 tablet (100 mg total) by mouth daily as needed for erectile dysfunction.        Allergies: No Known Allergies  Family History: No family history on file.  Social History:  reports that he quit smoking about 11 years ago. His smoking use included cigarettes. He started smoking about 46 years ago. He has never used smokeless tobacco. He reports that he does not currently use alcohol. He reports that he does not use drugs.  ROS: Pertinent ROS in HPI  Physical Exam: There were no vitals taken for this visit.  Constitutional:  Well nourished. Alert and oriented, No acute distress. HEENT: [place] AT, moist mucus membranes.  Trachea midline, no masses. Cardiovascular: No clubbing, cyanosis, or edema. Respiratory: Normal respiratory effort, no increased work of breathing. GI: Abdomen is soft, non tender, non distended, no abdominal masses. Liver and spleen not palpable.  No hernias appreciated.  Stool sample for occult testing is not indicated.   GU: No CVA tenderness.  No bladder fullness or masses.  Patient with circumcised/uncircumcised phallus. ***Foreskin easily retracted***  Urethral meatus is patent.  No penile discharge. No penile lesions or rashes. Scrotum without lesions, cysts, rashes and/or  edema.  Testicles are located scrotally bilaterally. No masses are appreciated in the testicles. Left and right epididymis are normal. Rectal: Patient with  normal sphincter tone. Anus and perineum without scarring or rashes. No rectal masses are appreciated. Prostate is approximately *** grams, *** nodules are appreciated. Seminal vesicles are normal. Skin: No rashes, bruises or suspicious lesions. Lymph: No cervical or inguinal adenopathy. Neurologic: Grossly intact, no focal deficits, moving all 4 extremities. Psychiatric: Normal mood and affect.   Laboratory Data: N/A   Pertinent Imaging: N/A  Assessment & Plan:    1. Right groin pain -CT x 2 w/o etiology for pain -UA negative  -urine culture negative  -atypical culture positive for ureaplasma, repeat atypical cultures sent  -exam benign  -EGD and colonoscopy are pending  2. BPH with LUTS -PSA normal  -DRE ***  3. ED -Testosterone level normal -Viagra 100 mg,  take 1 tablet 1 hour prior to intercourse.  Do not use with nitrates. ***  4.  Constipation -Scheduled for GI studies  No follow-ups on file.  These notes generated with voice recognition software. I apologize for typographical errors.  Cloretta Ned  [facility] [address]  [address] [place], [place] [address] [phone]

## 2023-06-06 ENCOUNTER — Ambulatory Visit: Payer: Self-pay | Admitting: Urology

## 2023-06-06 ENCOUNTER — Encounter: Payer: Self-pay | Admitting: Urology

## 2023-06-06 VITALS — BP 117/77 | HR 96 | Wt 152.5 lb

## 2023-06-06 DIAGNOSIS — R109 Unspecified abdominal pain: Secondary | ICD-10-CM

## 2023-06-06 DIAGNOSIS — N401 Enlarged prostate with lower urinary tract symptoms: Secondary | ICD-10-CM | POA: Diagnosis not present

## 2023-06-06 DIAGNOSIS — N529 Male erectile dysfunction, unspecified: Secondary | ICD-10-CM | POA: Diagnosis not present

## 2023-06-06 DIAGNOSIS — A493 Mycoplasma infection, unspecified site: Secondary | ICD-10-CM

## 2023-06-06 DIAGNOSIS — N341 Nonspecific urethritis: Secondary | ICD-10-CM

## 2023-06-06 DIAGNOSIS — N138 Other obstructive and reflux uropathy: Secondary | ICD-10-CM

## 2023-06-06 LAB — URINALYSIS, COMPLETE
Bilirubin, UA: NEGATIVE
Glucose, UA: NEGATIVE
Ketones, UA: NEGATIVE
Nitrite, UA: NEGATIVE
Protein,UA: NEGATIVE
Specific Gravity, UA: 1.01 (ref 1.005–1.030)
Urobilinogen, Ur: 0.2 mg/dL (ref 0.2–1.0)
pH, UA: 5.5 (ref 5.0–7.5)

## 2023-06-06 LAB — MICROSCOPIC EXAMINATION

## 2023-06-06 MED ORDER — TADALAFIL 5 MG PO TABS: 5.0000 mg | ORAL_TABLET | Freq: Every day | ORAL | 0 refills | Status: DC | PRN

## 2023-06-06 MED ORDER — TADALAFIL 20 MG PO TABS: ORAL_TABLET | ORAL | 3 refills | Status: DC

## 2023-06-08 ENCOUNTER — Telehealth: Payer: Self-pay

## 2023-06-08 NOTE — Telephone Encounter (Signed)
 Pt need a new set of instruction sent to him. I was going to send but date was different.

## 2023-06-11 ENCOUNTER — Telehealth: Payer: Self-pay

## 2023-06-11 ENCOUNTER — Other Ambulatory Visit: Payer: Self-pay

## 2023-06-11 NOTE — Telephone Encounter (Signed)
 Colonoscopy/EGD instructions mailed to patient today. Scheduled on 06-27-23.

## 2023-06-13 ENCOUNTER — Telehealth: Payer: Self-pay | Admitting: Family Medicine

## 2023-06-13 ENCOUNTER — Other Ambulatory Visit: Payer: Self-pay | Admitting: Family Medicine

## 2023-06-13 LAB — MYCOPLASMA / UREAPLASMA CULTURE
Mycoplasma hominis Culture: NEGATIVE
Ureaplasma urealyticum: NEGATIVE

## 2023-06-13 NOTE — Telephone Encounter (Signed)
 Copied from CRM [id number]. Topic: Clinical - Medication Refill >> Jun 13, 2023  2:33 PM Elle L wrote: Most Recent Primary Care Visit:  Provider: Ronnald Ramp  Department: [id number]  Visit Type: OFFICE VISIT  Date: 03/15/2023  Medication: ACCU-CHEK GUIDE test strip  Has the patient contacted their pharmacy? Yes  Is this the correct pharmacy for this prescription? Yes If no, delete pharmacy and type the correct one.  This is the patient's preferred pharmacy:  [facility] [address], [place] - [address] [place] [address] Phone: [phone] Fax: [phone]  Has the prescription been filled recently? No  Is the patient out of the medication? Yes, has not checked blood sugar in 2 days.   Has the patient been seen for an appointment in the last year OR does the patient have an upcoming appointment? Yes  Can we respond through MyChart? No  Agent: Please be advised that Rx refills may take up to 3 business days. We ask that you follow-up with your pharmacy.

## 2023-06-13 NOTE — Telephone Encounter (Signed)
 Copied from CRM [id number]. Topic: Clinical - Medication Question >> Jun 13, 2023  2:39 PM Elle L wrote: Reason for CRM: The patient is requesting to see is a higher quantity of ACCU-CHEK GUIDE test strips could be prescribed as he shares them with his wife. He is currently out of the test strips and has not tested his blood sugar in 2 days. The patient's call back number is [phone].

## 2023-06-14 ENCOUNTER — Encounter: Payer: Self-pay | Admitting: Family Medicine

## 2023-06-14 ENCOUNTER — Ambulatory Visit: Payer: Medicaid Other | Admitting: Family Medicine

## 2023-06-14 VITALS — BP 119/89 | HR 98 | Temp 98.4°F | Resp 16 | Ht 67.0 in | Wt 152.1 lb

## 2023-06-14 DIAGNOSIS — Z758 Other problems related to medical facilities and other health care: Secondary | ICD-10-CM

## 2023-06-14 DIAGNOSIS — E119 Type 2 diabetes mellitus without complications: Secondary | ICD-10-CM | POA: Diagnosis not present

## 2023-06-14 DIAGNOSIS — R109 Unspecified abdominal pain: Secondary | ICD-10-CM

## 2023-06-14 DIAGNOSIS — R053 Chronic cough: Secondary | ICD-10-CM | POA: Diagnosis not present

## 2023-06-14 DIAGNOSIS — M545 Low back pain, unspecified: Secondary | ICD-10-CM

## 2023-06-14 DIAGNOSIS — R7612 Nonspecific reaction to cell mediated immunity measurement of gamma interferon antigen response without active tuberculosis: Secondary | ICD-10-CM | POA: Diagnosis not present

## 2023-06-14 DIAGNOSIS — G8929 Other chronic pain: Secondary | ICD-10-CM

## 2023-06-14 MED ORDER — DULOXETINE HCL 60 MG PO CPEP: 60.0000 mg | ORAL_CAPSULE | Freq: Every day | ORAL | 3 refills | Status: DC

## 2023-06-14 MED ORDER — ACCU-CHEK SOFTCLIX LANCETS MISC: 6 refills | Status: DC

## 2023-06-14 MED ORDER — BENZONATATE 100 MG PO CAPS: 100.0000 mg | ORAL_CAPSULE | Freq: Two times a day (BID) | ORAL | 0 refills | Status: DC | PRN

## 2023-06-14 MED ORDER — DULOXETINE HCL 30 MG PO CPEP: 30.0000 mg | ORAL_CAPSULE | Freq: Every day | ORAL | 0 refills | Status: DC

## 2023-06-14 MED ORDER — ACCU-CHEK GUIDE TEST VI STRP: ORAL_STRIP | 12 refills | Status: DC

## 2023-06-14 MED ORDER — GABAPENTIN 300 MG PO CAPS: 300.0000 mg | ORAL_CAPSULE | Freq: Every day | ORAL | 3 refills | Status: DC

## 2023-06-14 NOTE — Progress Notes (Signed)
 Established patient visit   Patient: Vincent [name]   DOB: 03/26/1961   63 y.o. Male  MRN: [id number] Visit Date: 06/14/2023  Today's healthcare provider: Ronnald Ramp, MD   Chief Complaint  Patient presents with   Follow-up    Some pain but not much in the same area having spasms on right side and kidney area daily. Shoting pains in groing area up to his back  Having a dry cough as for a long time for years  Tb was treated for 6 months and then he had a severe bad cough  Having current back pain   Subjective     HPI     Follow-up    Additional comments: Some pain but not much in the same area having spasms on right side and kidney area daily. Shoting pains in groing area up to his back  Having a dry cough as for a long time for years  Tb was treated for 6 months and then he had a severe bad cough  Having current back pain      Last edited by Clois Comber on 06/14/2023  1:48 PM.       Discussed the use of AI scribe software for clinical note transcription with the patient, who gave verbal consent to proceed.  History of Present Illness   Vincent [name] is a 63 year old male who presents with abdominal pain and a chronic cough.  He experiences abdominal pain that has not improved with previous treatment for H. pylori infection. The pain is described as feeling swollen with spasms, primarily in the right upper quadrant, sometimes radiating to the ribs. It is persistent and does not improve with Protonix 40 mg, which he is currently taking. The pain is more pronounced on the right side, while the left side feels fine. No nausea, diarrhea, or constipation, and he reports having regular bowel movements. He has undergone two CT scans, which did not reveal any structural cause for the abdominal and groin pain.  He is experiencing a chronic cough, which is mostly dry and bothersome both day and night. He denies any recent illness that could have  contributed to the cough. A chest CT from December 2023 showed a right thyroid nodule and atelectatic changes but no significant lung abnormalities. He is currently taking Tessalon 100 mg twice a day for the cough.  He is managing diabetes and checks his blood sugar levels twice daily. He reports running out of test strips due to sharing them with his wife, who also requires them. He is awaiting a new supply of test strips and lancets.         Past Medical History:  Diagnosis Date   Chronic bilateral low back pain 12/05/2022   Diabetes mellitus without complication (HCC) 11/20/2022   Groin pain, right 10/13/2022   H. 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   Blood Glucose Monitoring Suppl DEVI 1 each by Does not apply route in the morning, at noon, and at bedtime. May substitute to any manufacturer covered by patient's  insurance.   pantoprazole (PROTONIX) 40 MG tablet Take 1 tablet (40 mg total) by mouth 2 (two) times daily for 14 days.   polyethylene glycol powder (GLYCOLAX/MIRALAX) 17 GM/SCOOP powder Take 17 g by mouth 2 (two) times daily as needed.   senna (SENOKOT) 8.6 MG tablet Take 1 tablet (8.6 mg total) by mouth 2 (two) times daily.   SitaGLIPtin-MetFORMIN HCl (JANUMET XR) 50-1000 MG TB24 Take 1 tablet by mouth in the morning and at bedtime.   tadalafil (CIALIS) 20 MG tablet Take one tablet 30 minutes prior to intercourse   tadalafil (CIALIS) 5 MG tablet Take 1 tablet (5 mg total) by mouth daily as needed for erectile dysfunction.    [DISCONTINUED] ACCU-CHEK GUIDE test strip Use as instructed   [DISCONTINUED] Accu-Chek Softclix Lancets lancets Use as instructed   [DISCONTINUED] gabapentin (NEURONTIN) 100 MG capsule Take 1 capsule (100 mg total) by mouth at bedtime.   No facility-administered medications prior to visit.    Review of Systems  Last metabolic panel Lab Results  Component Value Date   GLUCOSE 189 (H) 01/16/2023   NA 141 01/16/2023   K 4.6 01/16/2023   CL 102 01/16/2023   CO2 23 01/16/2023   BUN 9 01/16/2023   CREATININE 1.14 01/16/2023   EGFR 73 01/16/2023   CALCIUM 9.9 01/16/2023   PROT 8.1 01/16/2023   ALBUMIN 4.5 01/16/2023   LABGLOB 3.6 01/16/2023   BILITOT 0.6 01/16/2023   ALKPHOS 120 01/16/2023   AST 21 01/16/2023   ALT 28 01/16/2023   ANIONGAP 5 03/11/2022   Last lipids No results found for: "CHOL", "HDL", "LDLCALC", "LDLDIRECT", "TRIG", "CHOLHDL" Last hemoglobin A1c Lab Results  Component Value Date   HGBA1C 7.6 (H) 01/16/2023        Objective    BP 119/89 (BP Location: Left Arm, Patient Position: Sitting, Cuff Size: Normal)   Pulse 98   Temp 98.4 F (36.9 C)   Resp 16   Ht 5\' 7"  (1.702 m)   Wt 152 lb 1.6 oz (69 kg)   SpO2 99%   BMI 23.82 kg/m  BP Readings from Last 3 Encounters:  06/14/23 119/89  06/06/23 117/77  05/09/23 110/78   Wt Readings from Last 3 Encounters:  06/14/23 152 lb 1.6 oz (69 kg)  06/06/23 152 lb 8 oz (69.2 kg)  05/03/23 150 lb 9.6 oz (68.3 kg)        Physical Exam  Physical Exam   ABDOMEN: Abdomen soft with possible gas distension. Right upper and lower quadrant tenderness, normal BS       No results found for any visits on 06/14/23.  Assessment & Plan     Problem List Items Addressed This Visit       Endocrine   Diabetes mellitus without complication (HCC) - Primary   Relevant Medications   glucose blood (ACCU-CHEK GUIDE TEST) test strip   Other Relevant Orders   Hemoglobin A1c   Lipid panel   Ambulatory referral to  Ophthalmology     Other   Swahili language interpreter needed   Right sided abdominal pain   Relevant Medications   gabapentin (NEURONTIN) 300 MG capsule   Positive QuantiFERON-TB Gold test   Chronic bilateral low back pain   Relevant Medications   DULoxetine (CYMBALTA) 60 MG capsule   DULoxetine (CYMBALTA) 30 MG capsule   gabapentin (NEURONTIN) 300 MG capsule   Other Visit Diagnoses       Chronic cough       Relevant Medications  benzonatate (TESSALON) 100 MG capsule   Other Relevant Orders   DG Chest 2 View       Assessment and Plan    Abdominal Pain Chronic abdominal pain with no structural abnormalities on two CT scans. Pain persists despite H. pylori treatment. Symptoms include right upper quadrant spasms without nausea, diarrhea, or constipation. Differential diagnosis includes nerve pain or gastrointestinal issues. Gastroenterology referral for colonoscopy and EGD is pending. Considering pain management referral for potential nerve pain treatment. - Continue Protonix 40 mg for heartburn - Increase gabapentin to 300 mg at night for nerve pain - Start Cymbalta 30 mg once daily for one week, then increase to 60 mg once daily - Proceed with scheduled colonoscopy and EGD - Consider referral to pain management for potential nerve pain treatment  Chronic Cough Chronic dry cough with no recent illness. Previous chest CT in December 2023 showed no lung abnormalities. Considering updated chest x-ray to rule out new developments. He prefers medication management over further imaging at this time. - Prescribe Tessalon Perles 100 mg twice daily for cough - Order chest x-ray at outpatient imaging center if symptoms persist  Diabetes Mellitus Diabetes management includes regular blood glucose monitoring. He shares test strips with his wife, leading to shortages. No recent eye exam for diabetes-related complications. Emphasized importance of separate supplies for effective  management. - Order separate test strips and lancets for him and his wife - Check A1c and cholesterol levels - Refer to eye specialist for annual diabetes eye exam  Follow-up Follow-up plans discussed for imaging, blood work, and specialist referrals. - Complete blood work - Schedule follow-up appointment after imaging and specialist evaluations         Return in about 3 months (around 09/14/2023) for DM.         Ronnald Ramp, MD  [facility] [phone] (phone) [phone] (fax)  [facility]

## 2023-06-14 NOTE — Telephone Encounter (Signed)
 Called with an interpreter on the line, it was explained and advised he can't share the test strips, if his wife needs test strips she would need to reach out to her health care provider to assist with this matter. The pt has also been informed he has refills, he need to reach to the pharmacy to have it filled.

## 2023-06-14 NOTE — Patient Instructions (Addendum)
 Please report to [facility] located at:  [address]  [place], [place] [address]  You do not need an appointment to have xrays completed.   Our office will follow up with  results once available.  [employer]    VISIT SUMMARY:  Today, we discussed your ongoing abdominal pain, chronic cough, and diabetes management. We reviewed your symptoms, current medications, and recent imaging results. We also planned further evaluations and adjustments to your treatment plan to better manage your conditions.  YOUR PLAN:  -ABDOMINAL PAIN: Your abdominal pain, which has not improved with previous treatments, may be due to nerve pain or gastrointestinal issues. We will continue your current medication for heartburn, increase your dose of gabapentin for nerve pain, and start a new medication called Cymbalta. We will also proceed with your scheduled colonoscopy and EGD to further investigate the cause of your pain. If needed, we may refer you to a pain management specialist.  -CHRONIC COUGH: Your chronic dry cough has not been linked to any recent illness or significant lung abnormalities. We will continue your current medication, Tessalon Perles, to help manage the cough. If your symptoms persist, we may order a chest x-ray to check for any new developments.  -DIABETES MELLITUS: Diabetes requires regular monitoring of blood sugar levels. We discussed the importance of having separate test strips and lancets for you and your wife to ensure effective management. We will order these supplies for both of you. Additionally, we will check your A1c and cholesterol levels and refer you to an eye specialist for your annual diabetes eye exam.  INSTRUCTIONS:  Please complete the blood work as discussed and proceed with the scheduled colonoscopy and EGD. Ensure you have separate test strips and lancets for you and your wife. Schedule a follow-up appointment after completing the  imaging and specialist evaluations.  For more information, you can read your full clinical note, available in your patient portal.

## 2023-06-15 LAB — HEMOGLOBIN A1C
Est. average glucose Bld gHb Est-mCnc: 174 mg/dL
Hgb A1c MFr Bld: 7.7 % — ABNORMAL HIGH (ref 4.8–5.6)

## 2023-06-15 LAB — LIPID PANEL
Chol/HDL Ratio: 4.7 ratio (ref 0.0–5.0)
Cholesterol, Total: 161 mg/dL (ref 100–199)
HDL: 34 mg/dL — ABNORMAL LOW (ref >39)
LDL Chol Calc (NIH): 95 mg/dL (ref 0–99)
Triglycerides: 187 mg/dL — ABNORMAL HIGH (ref 0–149)
VLDL Cholesterol Cal: 32 mg/dL (ref 5–40)

## 2023-06-19 ENCOUNTER — Telehealth: Payer: Self-pay | Admitting: *Deleted

## 2023-06-19 NOTE — Telephone Encounter (Signed)
 Patient and son called office yesterday (06/18/2023) afternoon but they called so late, I was not able to get an interpreter. So I have called this morning (06/19/2023) with interpreter to go over colonoscopy instructions and questions with patient and wife.  Spoken to patient with Swahili interpreter Link Snuffer).They had it on speaker so both can speak if needed.  I went over patient's wife instructions first. We went over the low fiber diet then clear diet then SUPREP directions. This was sent on the first colonoscopy. They had to reschedule due communication barrier.  We went over patient's instructions afterward but he was given NULYTELY.  Reminded pateint to stop Janumet 3 days before procedure.  Gave the number to the endo unit at [facility]. Asked patient to call the number if they missed the call from endo unit. Inform patient and wife that they should have their son call just in case if there is no interpreter during the call.  Patient and wife verbalized understanding.

## 2023-06-26 ENCOUNTER — Encounter: Payer: Self-pay | Admitting: Gastroenterology

## 2023-06-27 ENCOUNTER — Encounter
Admission: RE | Disposition: A | Discharge status: Home / Self Care | Payer: Self-pay | Source: Home / Self Care | Attending: Gastroenterology

## 2023-06-27 ENCOUNTER — Encounter: Payer: Self-pay | Admitting: Gastroenterology

## 2023-06-27 ENCOUNTER — Ambulatory Visit: Admitting: Certified Registered Nurse Anesthetist

## 2023-06-27 ENCOUNTER — Ambulatory Visit
Admission: RE | Admit: 2023-06-27 | Discharge: 2023-06-27 | Disposition: A | Discharge status: Home / Self Care | Payer: Medicaid Other | Attending: Gastroenterology | Admitting: Gastroenterology

## 2023-06-27 DIAGNOSIS — K219 Gastro-esophageal reflux disease without esophagitis: Secondary | ICD-10-CM | POA: Insufficient documentation

## 2023-06-27 DIAGNOSIS — R109 Unspecified abdominal pain: Secondary | ICD-10-CM | POA: Diagnosis not present

## 2023-06-27 DIAGNOSIS — K635 Polyp of colon: Secondary | ICD-10-CM | POA: Diagnosis not present

## 2023-06-27 DIAGNOSIS — Z1211 Encounter for screening for malignant neoplasm of colon: Secondary | ICD-10-CM

## 2023-06-27 DIAGNOSIS — E119 Type 2 diabetes mellitus without complications: Secondary | ICD-10-CM | POA: Insufficient documentation

## 2023-06-27 DIAGNOSIS — K2950 Unspecified chronic gastritis without bleeding: Secondary | ICD-10-CM | POA: Diagnosis not present

## 2023-06-27 DIAGNOSIS — Z87891 Personal history of nicotine dependence: Secondary | ICD-10-CM | POA: Diagnosis not present

## 2023-06-27 DIAGNOSIS — D122 Benign neoplasm of ascending colon: Secondary | ICD-10-CM | POA: Insufficient documentation

## 2023-06-27 DIAGNOSIS — B9681 Helicobacter pylori [H. pylori] as the cause of diseases classified elsewhere: Secondary | ICD-10-CM | POA: Diagnosis not present

## 2023-06-27 DIAGNOSIS — K2970 Gastritis, unspecified, without bleeding: Secondary | ICD-10-CM | POA: Insufficient documentation

## 2023-06-27 DIAGNOSIS — K5730 Diverticulosis of large intestine without perforation or abscess without bleeding: Secondary | ICD-10-CM

## 2023-06-27 DIAGNOSIS — Z8619 Personal history of other infectious and parasitic diseases: Secondary | ICD-10-CM

## 2023-06-27 DIAGNOSIS — K640 First degree hemorrhoids: Secondary | ICD-10-CM | POA: Insufficient documentation

## 2023-06-27 DIAGNOSIS — D126 Benign neoplasm of colon, unspecified: Secondary | ICD-10-CM

## 2023-06-27 HISTORY — PX: ESOPHAGOGASTRODUODENOSCOPY (EGD) WITH PROPOFOL: SHX5813

## 2023-06-27 HISTORY — PX: COLONOSCOPY WITH PROPOFOL: SHX5780

## 2023-06-27 SURGERY — ESOPHAGOGASTRODUODENOSCOPY (EGD) WITH PROPOFOL
Anesthesia: General

## 2023-06-27 MED ORDER — SODIUM CHLORIDE 0.9 % IV SOLN
INTRAVENOUS | Status: DC
Start: 2023-06-27 — End: 2023-06-27

## 2023-06-27 MED ORDER — DEXMEDETOMIDINE HCL IN NACL 80 MCG/20ML IV SOLN
INTRAVENOUS | Status: DC | PRN
  Administered 2023-06-27: 8 ug via INTRAVENOUS

## 2023-06-27 MED ORDER — LIDOCAINE HCL (CARDIAC) PF 100 MG/5ML IV SOSY
PREFILLED_SYRINGE | INTRAVENOUS | Status: DC | PRN
  Administered 2023-06-27: 50 mg via INTRAVENOUS

## 2023-06-27 MED ORDER — PROPOFOL 10 MG/ML IV BOLUS
INTRAVENOUS | Status: DC | PRN
  Administered 2023-06-27: 80 mg via INTRAVENOUS
  Administered 2023-06-27: 125 ug/kg/min via INTRAVENOUS

## 2023-06-27 NOTE — Op Note (Signed)
 [facility] Gastroenterology Patient Name: Vincent [name] Procedure Date: 06/27/2023 10:16 AM MRN: [id number] Account #: [id number] Date of Birth: 19-May-1960 Admit Type: Outpatient Age: 63 Room: [facility] ENDO ROOM 3 Gender: Male Note Status: Finalized Instrument Name: Prentice Docker [id number] Procedure:             Colonoscopy Indications:           Screening for colorectal malignant neoplasm Providers:             Wyline Mood MD, MD Referring MD:          Wyline Mood MD, MD (Referring MD), No Local Md, MD                         (Referring MD) Medicines:             Monitored Anesthesia Care Complications:         No immediate complications. Procedure:             Pre-Anesthesia Assessment:                        - Prior to the procedure, a History and Physical was                         performed, and patient medications, allergies and                         sensitivities were reviewed. The patient's tolerance                         of previous anesthesia was reviewed.                        - The risks and benefits of the procedure and the                         sedation options and risks were discussed with the                         patient. All questions were answered and informed                         consent was obtained.                        - ASA Grade Assessment: II - A patient with mild                         systemic disease.                        After obtaining informed consent, the colonoscope was                         passed under direct vision. Throughout the procedure,                         the patient's blood pressure, pulse, and oxygen                         saturations  were monitored continuously. The                         Colonoscope was introduced through the anus with the                         intention of advancing to the cecum. The scope was                         advanced to the ascending colon before the procedure                          was aborted. Medications were given. The colonoscopy                         was technically difficult and complex due to                         significant looping. The patient tolerated the                         procedure well. The quality of the bowel preparation                         was good. The ileocecal valve, appendiceal orifice,                         and rectum were photographed. Findings:      The perianal and digital rectal examinations were normal.      Two sessile polyps were found in the ascending colon. The polyps were 4       to 5 mm in size. These polyps were removed with a cold snare. Resection       and retrieval were complete.      Non-bleeding internal hemorrhoids were found during retroflexion. The       hemorrhoids were large and Grade I (internal hemorrhoids that do not       prolapse).      Multiple medium-mouthed diverticula were found in the right colon. Impression:            - Two 4 to 5 mm polyps in the ascending colon, removed                         with a cold snare. Resected and retrieved.                        - Non-bleeding internal hemorrhoids. Recommendation:        - Discharge patient to home (with escort).                        - Resume previous diet.                        - Continue present medications.                        - Sggest virtual ct colonography to evaluate remained  of colon since procedure was incompklete due to looping Procedure Code(s):     --- Professional ---                        [phone], 52, Colonoscopy, flexible; with removal of                         tumor(s), polyp(s), or other lesion(s) by snare                         technique Diagnosis Code(s):     --- Professional ---                        Z12.11, Encounter for screening for malignant neoplasm                         of colon                        D12.2, Benign neoplasm of ascending colon                         K64.0, First degree hemorrhoids CPT copyright 2022 American Medical Association. All rights reserved. The codes documented in this report are preliminary and upon coder review may  be revised to meet current compliance requirements. Wyline Mood, MD Wyline Mood MD, MD 06/27/2023 11:29:57 AM This report has been signed electronically. Number of Addenda: 0 Note Initiated On: 06/27/2023 10:16 AM Total Procedure Duration: 0 hours 21 minutes 48 seconds  Estimated Blood Loss:  Estimated blood loss: none.      [facility]

## 2023-06-27 NOTE — Transfer of Care (Signed)
 Immediate Anesthesia Transfer of Care Note  Patient: Vincent [name]  Procedure(s) Performed: ESOPHAGOGASTRODUODENOSCOPY (EGD) WITH PROPOFOL COLONOSCOPY WITH PROPOFOL  Patient Location: Endoscopy Unit  Anesthesia Type:General  Level of Consciousness: sedated  Airway & Oxygen Therapy: Patient Spontanous Breathing  Post-op Assessment: Report given to RN and Post -op Vital signs reviewed and stable  Post vital signs: Reviewed and stable  Last Vitals:  Vitals Value Taken Time  BP 84/62 06/27/23 1130  Temp    Pulse 85 06/27/23 1130  Resp 14 06/27/23 1130  SpO2 95 % 06/27/23 1130  Vitals shown include unfiled device data.  Last Pain:  Vitals:   06/27/23 1130  TempSrc:   PainSc: Asleep         Complications: No notable events documented.

## 2023-06-27 NOTE — Anesthesia Preprocedure Evaluation (Signed)
 Anesthesia Evaluation  Patient identified by MRN, date of birth, ID band Patient awake    Reviewed: Allergy & Precautions, NPO status , Patient's Chart, lab work & pertinent test results  History of Anesthesia Complications Negative for: history of anesthetic complications  Airway Mallampati: III  TM Distance: >3 FB Neck ROM: full    Dental  (+) Chipped, Poor Dentition, Missing   Pulmonary neg shortness of breath, former smoker   Pulmonary exam normal        Cardiovascular Exercise Tolerance: Good (-) angina Normal cardiovascular exam     Neuro/Psych negative neurological ROS  negative psych ROS   GI/Hepatic negative GI ROS, Neg liver ROS,neg GERD  ,,  Endo/Other  negative endocrine ROSdiabetes, Type 2    Renal/GU negative Renal ROS  negative genitourinary   Musculoskeletal   Abdominal   Peds  Hematology negative hematology ROS (+)   Anesthesia Other Findings Past Medical History: 12/05/2022: Chronic bilateral low back pain 11/20/2022: Diabetes mellitus without complication (HCC) 10/13/2022: Groin pain, right 02/16/2023: H. pylori infection 01/16/2023: Hepatitis B core antibody positive 11/20/2022: Other constipation 07/07/2021: Positive QuantiFERON-TB Gold test     Comment:  07/07/21 Positive T-Spot by [facility]   10/13/2022: Right sided abdominal pain 03/15/2023: Right testicular pain 11/20/2022: Swahili language interpreter needed  Past Surgical History: No date: denies No date: NO PAST SURGERIES     Reproductive/Obstetrics negative OB ROS                             Anesthesia Physical Anesthesia Plan  ASA: 3  Anesthesia Plan: General   Post-op Pain Management:    Induction: Intravenous  PONV Risk Score and Plan: Propofol infusion and TIVA  Airway Management Planned: Natural Airway and Nasal Cannula  Additional Equipment:   Intra-op Plan:    Post-operative Plan:   Informed Consent: I have reviewed the patients History and Physical, chart, labs and discussed the procedure including the risks, benefits and alternatives for the proposed anesthesia with the patient or authorized representative who has indicated his/her understanding and acceptance.     Dental Advisory Given and Interpreter used for interview  Plan Discussed with: Anesthesiologist, CRNA and Surgeon  Anesthesia Plan Comments: (Patient consented for risks of anesthesia including but not limited to:  - adverse reactions to medications - risk of airway placement if required - damage to eyes, teeth, lips or other oral mucosa - nerve damage due to positioning  - sore throat or hoarseness - Damage to heart, brain, nerves, lungs, other parts of body or loss of life  Patient voiced understanding and assent.)       Anesthesia Quick Evaluation

## 2023-06-27 NOTE — H&P (Addendum)
 Wyline Mood, MD [address], [address], [place], [place], [address] [address], [address], [place], [place], [address] Phone: [phone]  Fax: [phone]  Primary Care Physician:  Ronnald Ramp, MD   Pre-Procedure History & Physical: HPI:  Vincent [name] is a 63 y.o. male is here for an endoscopy and colonoscopy    Past Medical History:  Diagnosis Date   Chronic bilateral low back pain 12/05/2022   Diabetes mellitus without complication (HCC) 11/20/2022   Groin pain, right 10/13/2022   H. 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   07/07/21 Positive T-Spot by [facility]     Right sided abdominal pain 10/13/2022   Right testicular pain 03/15/2023   Swahili language interpreter needed 11/20/2022    Past Surgical History:  Procedure Laterality Date   denies     NO PAST SURGERIES      Prior to Admission medications   Medication Sig Start Date End Date Taking? Authorizing Provider  DULoxetine (CYMBALTA) 30 MG capsule Take 1 capsule (30 mg total) by mouth daily. 06/14/23  Yes Simmons-Robinson, [name], MD  gabapentin (NEURONTIN) 300 MG capsule Take 1 capsule (300 mg total) by mouth at bedtime. 06/14/23  Yes Simmons-Robinson, [name], MD  Accu-Chek Softclix Lancets lancets Use as instructed 06/14/23   Simmons-Robinson, Tawanna Cooler, MD  benzonatate (TESSALON) 100 MG capsule Take 1 capsule (100 mg total) by mouth 2 (two) times daily as needed for cough. 06/14/23   Simmons-Robinson, Tawanna Cooler, MD  Blood Glucose Monitoring Suppl (ACCU-CHEK GUIDE) w/Device KIT See admin instructions. 12/13/22   [provider]  Blood Glucose Monitoring Suppl DEVI 1 each by Does not apply route in the morning, at noon, and at bedtime. May substitute to any manufacturer covered by patient's insurance. 12/04/22   Debera Lat, PA-C  DULoxetine (CYMBALTA) 60 MG capsule  Take 1 capsule (60 mg total) by mouth daily. 06/14/23   Simmons-Robinson, [name], MD  glucose blood (ACCU-CHEK GUIDE TEST) test strip Use as instructed 06/14/23   Simmons-Robinson, [name], MD  pantoprazole (PROTONIX) 40 MG tablet Take 1 tablet (40 mg total) by mouth 2 (two) times daily for 14 days. 05/08/23 05/22/23  Celso Amy, PA-C  polyethylene glycol powder (GLYCOLAX/MIRALAX) 17 GM/SCOOP powder Take 17 g by mouth 2 (two) times daily as needed. 04/02/23   Simmons-Robinson, Tawanna Cooler, MD  senna (SENOKOT) 8.6 MG tablet Take 1 tablet (8.6 mg total) by mouth 2 (two) times daily. 04/02/23   Simmons-Robinson, Tawanna Cooler, MD  SitaGLIPtin-MetFORMIN HCl (JANUMET XR) 50-1000 MG TB24 Take 1 tablet by mouth in the morning and at bedtime. 05/01/23   Simmons-Robinson, Tawanna Cooler, MD  tadalafil (CIALIS) 20 MG tablet Take one tablet 30 minutes prior to intercourse 06/06/23   Michiel Cowboy A, PA-C  tadalafil (CIALIS) 5 MG tablet Take 1 tablet (5 mg total) by mouth daily as needed for erectile dysfunction. 06/06/23   Michiel Cowboy A, PA-C    Allergies as of 06/01/2023   (No Known Allergies)    History reviewed. No pertinent family history.  Social History   Socioeconomic History   Marital status: Married    Spouse name: Not on file   Number of children: Not on file   Years of education: Not on file   Highest education level: Not on file  Occupational History   Not on file  Tobacco Use   Smoking status: Former    Current packs/day: 0.00  Types: Cigarettes    Start date: 51    Quit date: 2014    Years since quitting: 11.2   Smokeless tobacco: Never  Vaping Use   Vaping status: Never Used  Substance and Sexual Activity   Alcohol use: Not Currently    [place]   Drug use: Never   Sexual activity: Not on file  Other Topics Concern   Not on file  Social History Narrative   Not on file   Social Drivers of Health   Financial Resource Strain: Medium  Risk (05/21/2023)   Overall Financial Resource Strain (CARDIA)    Difficulty of Paying Living Expenses: Somewhat hard  Food Insecurity: Food Insecurity Present (05/21/2023)   Hunger Vital Sign    Worried About Running Out of Food in the Last Year: Often true    Ran Out of Food in the Last Year: Not on file  Transportation Needs: No Transportation Needs (05/21/2023)   PRAPARE - Administrator, Civil Service (Medical): No    Lack of Transportation (Non-Medical): No  Physical Activity: Not on file  Stress: Not on file  Social Connections: Not on file  Intimate Partner Violence: Not on file    Review of Systems: See HPI, otherwise negative ROS  Physical Exam: There were no vitals taken for this visit. General:   Alert,  pleasant and cooperative in NAD Head:  Normocephalic and atraumatic. Neck:  Supple; no masses or thyromegaly. Lungs:  Clear throughout to auscultation, normal respiratory effort.    Heart:  +S1, +S2, Regular rate and rhythm, No edema. Abdomen:  Soft, nontender and nondistended. Normal bowel sounds, without guarding, and without rebound.   Neurologic:  Alert and  oriented x4;  grossly normal neurologically.  Impression/Plan: Vincent [name] is here for an endoscopy and colonoscopy  to be performed for  evaluation of abdominal pain and colon cancer screening     Risks, benefits, limitations, and alternatives regarding endoscopy have been reviewed with the patient via interpretor.  Questions have been answered.  All parties agreeable.   Wyline Mood, MD  06/27/2023, 10:26 AM

## 2023-06-27 NOTE — Op Note (Signed)
 [facility] Patient Name: Vincent [name] Procedure Date: 06/27/2023 10:17 AM MRN: [id number] Account #: [id number] Date of Birth: February 16, 1961 Admit Type: Outpatient Age: 63 Room: [facility] ENDO ROOM 3 Gender: Male Note Status: Finalized Instrument Name: Upper Endoscope [id number] Procedure:             Upper GI endoscopy Indications:           Abdominal pain Providers:             Wyline Mood MD, MD Referring MD:          Wyline Mood MD, MD (Referring MD), No Local Md, MD                         (Referring MD) Medicines:             Monitored Anesthesia Care Complications:         No immediate complications. Procedure:             Pre-Anesthesia Assessment:                        - Prior to the procedure, a History and Physical was                         performed, and patient medications, allergies and                         sensitivities were reviewed. The patient's tolerance                         of previous anesthesia was reviewed.                        - The risks and benefits of the procedure and the                         sedation options and risks were discussed with the                         patient. All questions were answered and informed                         consent was obtained.                        - ASA Grade Assessment: II - A patient with mild                         systemic disease.                        After obtaining informed consent, the endoscope was                         passed under direct vision. Throughout the procedure,                         the patient's blood pressure, pulse, and oxygen                         saturations  were monitored continuously. The Endoscope                         was introduced through the mouth, and advanced to the                         third part of duodenum. The upper GI endoscopy was                         accomplished with ease. The patient tolerated the                          procedure well. Findings:      The esophagus was normal.      The examined duodenum was normal.      The cardia and gastric fundus were normal on retroflexion.      Localized moderate inflammation characterized by congestion (edema) and       erythema was found in the gastric antrum. Biopsies were taken with a       cold forceps for histology. Impression:            - Normal esophagus.                        - Normal examined duodenum.                        - Gastritis. Biopsied. Recommendation:        - Await pathology results.                        - Perform a colonoscopy today. Procedure Code(s):     --- Professional ---                        [phone], Esophagogastroduodenoscopy, flexible,                         transoral; with biopsy, single or multiple Diagnosis Code(s):     --- Professional ---                        K29.70, Gastritis, unspecified, without bleeding                        R10.9, Unspecified abdominal pain CPT copyright 2022 American Medical Association. All rights reserved. The codes documented in this report are preliminary and upon coder review may  be revised to meet current compliance requirements. Wyline Mood, MD Wyline Mood MD, MD 06/27/2023 11:00:16 AM This report has been signed electronically. Number of Addenda: 0 Note Initiated On: 06/27/2023 10:17 AM Estimated Blood Loss:  Estimated blood loss: none.      [facility]

## 2023-06-27 NOTE — Anesthesia Procedure Notes (Signed)
 Date/Time: 06/27/2023 10:52 AM  Performed by: Ginger Carne, CRNAPre-anesthesia Checklist: Patient identified, Emergency Drugs available, Suction available and Patient being monitored Patient Re-evaluated:Patient Re-evaluated prior to induction Oxygen Delivery Method: Nasal cannula Preoxygenation: Pre-oxygenation with 100% oxygen Induction Type: IV induction

## 2023-06-27 NOTE — Anesthesia Postprocedure Evaluation (Signed)
 Anesthesia Post Note  Patient: Vincent [name]  Procedure(s) Performed: ESOPHAGOGASTRODUODENOSCOPY (EGD) WITH PROPOFOL COLONOSCOPY WITH PROPOFOL  Patient location during evaluation: Endoscopy Anesthesia Type: General Level of consciousness: awake and alert Pain management: pain level controlled Vital Signs Assessment: post-procedure vital signs reviewed and stable Respiratory status: spontaneous breathing, nonlabored ventilation, respiratory function stable and patient connected to nasal cannula oxygen Cardiovascular status: blood pressure returned to baseline and stable Postop Assessment: no apparent nausea or vomiting Anesthetic complications: no   No notable events documented.   Last Vitals:  Vitals:   06/27/23 1140 06/27/23 1150  BP: (!) 89/73 105/78  Pulse: 80 77  Resp: 19 17  Temp:    SpO2: 97% 100%    Last Pain:  Vitals:   06/27/23 1140  TempSrc:   PainSc: 0-No pain                 Cleda Mccreedy Cassandr Cederberg

## 2023-06-28 LAB — SURGICAL PATHOLOGY

## 2023-07-05 ENCOUNTER — Telehealth: Payer: Self-pay

## 2023-07-05 DIAGNOSIS — K562 Volvulus: Secondary | ICD-10-CM

## 2023-07-05 NOTE — Telephone Encounter (Signed)
 Spoke with patient using interpreter services- Ct virtual order, colonoscopy notes and insurance information all faxed to [phone]- also discussed Medicaid transportation and for patient to call [phone] to register for transportation- I suggested having a friend or relative to be on the call as they do not offer interpreter services other than spanish language- I let him know [facility] Ginette Otto will be calling to get the Ct appointment scheduled and will provide all prep information. Patient he understood all details of this call through the services offered.

## 2023-07-05 NOTE — Telephone Encounter (Signed)
 Interpreter services used for this patient-schedule virtual CT per Dr.Anna- facility to call patient to schedule the appointment-

## 2023-07-17 ENCOUNTER — Telehealth: Payer: Self-pay

## 2023-07-17 MED ORDER — VOQUEZNA TRIPLE PAK 500-500-20 MG PO THPK: 1.0000 | PACK | Freq: Two times a day (BID) | ORAL | 0 refills | Status: AC

## 2023-07-17 NOTE — Telephone Encounter (Signed)
 Contacted the interpreter line so they could let the patient know that he is needing to take Voquezna Triple pak twice a day for 14 days. Patient understand.

## 2023-07-17 NOTE — Telephone Encounter (Signed)
-----   Message from Wyline Mood sent at 07/11/2023 11:01 AM EDT ----- H pylori gastritis   Requires Voquenza based triple therapy : VOQUEZNA TRIPLE PAK Take your medication 2 times per day, 12 hours apart: in the morning and in the evening.  Check for any allergies before sending script  Kiran

## 2023-07-24 ENCOUNTER — Telehealth: Payer: Self-pay

## 2023-07-24 NOTE — Telephone Encounter (Signed)
 Patient is scheduled for CT virtual colonoscopy 08/29/23

## 2023-07-24 NOTE — Telephone Encounter (Signed)
 Spoke with patient today using interperter services-swahili language- and he stated no one has called to schedule the CT virtual colonoscopy-  I called [facility] on 4/2-again 07/19/23 spoke with scheduling and someone stated they would reach out to him- I also spoke with Gurney Lefort on 07/21/23 and was told to check back in a few days to see if appointment has been scheduled- I spoke with Pansy 07/23/23 and was told she would call patient and schedule appointment- I reminded her that she will need interpreter services-I checked this morning 07/24/23 to see if appointment had been made and it has not as of yet- I spoke with patient to see if he is available this morning- and then I called [facility] and spoke with Felicia to let her know he is available and asked if someone could get this scheduled since patient is available-

## 2023-07-26 NOTE — Telephone Encounter (Signed)
 Spoke with patient using interpreter services to let him know that his appointment on 08/01/23 is cancelled and we will call him after he has CT Virtual colonoscopy.

## 2023-08-01 ENCOUNTER — Ambulatory Visit: Payer: Medicaid Other | Admitting: Gastroenterology

## 2023-08-03 ENCOUNTER — Other Ambulatory Visit: Payer: Self-pay

## 2023-08-03 ENCOUNTER — Encounter: Payer: Self-pay | Admitting: Intensive Care

## 2023-08-03 ENCOUNTER — Emergency Department
Admission: EM | Admit: 2023-08-03 | Discharge: 2023-08-03 | Disposition: A | Discharge status: Home / Self Care | Attending: Emergency Medicine | Admitting: Emergency Medicine

## 2023-08-03 ENCOUNTER — Emergency Department

## 2023-08-03 DIAGNOSIS — G8929 Other chronic pain: Secondary | ICD-10-CM | POA: Diagnosis present

## 2023-08-03 DIAGNOSIS — G894 Chronic pain syndrome: Secondary | ICD-10-CM | POA: Insufficient documentation

## 2023-08-03 DIAGNOSIS — E119 Type 2 diabetes mellitus without complications: Secondary | ICD-10-CM | POA: Diagnosis not present

## 2023-08-03 LAB — BASIC METABOLIC PANEL WITH GFR
Anion gap: 6 (ref 5–15)
BUN: 10 mg/dL (ref 8–23)
CO2: 23 mmol/L (ref 22–32)
Calcium: 8.5 mg/dL — ABNORMAL LOW (ref 8.9–10.3)
Chloride: 107 mmol/L (ref 98–111)
Creatinine, Ser: 1.13 mg/dL (ref 0.61–1.24)
GFR, Estimated: >60 mL/min (ref >60)
Glucose, Bld: 130 mg/dL — ABNORMAL HIGH (ref 70–99)
Potassium: 3.9 mmol/L (ref 3.5–5.1)
Sodium: 136 mmol/L (ref 135–145)

## 2023-08-03 LAB — HEPATIC FUNCTION PANEL
ALT: 16 U/L (ref 0–44)
AST: 19 U/L (ref 15–41)
Albumin: 3.7 g/dL (ref 3.5–5.0)
Alkaline Phosphatase: 67 U/L (ref 38–126)
Bilirubin, Direct: 0.1 mg/dL (ref 0.0–0.2)
Indirect Bilirubin: 0.8 mg/dL (ref 0.3–0.9)
Total Bilirubin: 0.9 mg/dL (ref 0.0–1.2)
Total Protein: 7.7 g/dL (ref 6.5–8.1)

## 2023-08-03 LAB — CBC
HCT: 44.3 % (ref 39.0–52.0)
Hemoglobin: 14.5 g/dL (ref 13.0–17.0)
MCH: 29.4 pg (ref 26.0–34.0)
MCHC: 32.7 g/dL (ref 30.0–36.0)
MCV: 89.7 fL (ref 80.0–100.0)
Platelets: 204 10*3/uL (ref 150–400)
RBC: 4.94 MIL/uL (ref 4.22–5.81)
RDW: 11.9 % (ref 11.5–15.5)
WBC: 4.7 10*3/uL (ref 4.0–10.5)
nRBC: 0 % (ref 0.0–0.2)

## 2023-08-03 LAB — LIPASE, BLOOD: Lipase: 49 U/L (ref 11–51)

## 2023-08-03 LAB — TROPONIN I (HIGH SENSITIVITY): Troponin I (High Sensitivity): 3 ng/L (ref <18)

## 2023-08-03 MED ORDER — OXYCODONE HCL 5 MG PO TABS
5.0000 mg | ORAL_TABLET | Freq: Once | ORAL | Status: DC
  Filled 2023-08-03: qty 1

## 2023-08-03 MED ORDER — ACETAMINOPHEN 500 MG PO TABS
1000.0000 mg | ORAL_TABLET | Freq: Once | ORAL | Status: AC
  Administered 2023-08-03: 1000 mg via ORAL
  Filled 2023-08-03: qty 2

## 2023-08-03 NOTE — Discharge Instructions (Signed)
Please take Tylenol and ibuprofen/Advil for your pain.  It is safe to take them together, or to alternate them every few hours.  Take up to 1000mg of Tylenol at a time, up to 4 times per day.  Do not take more than 4000 mg of Tylenol in 24 hours.  For ibuprofen, take 400-600 mg, 3 - 4 times per day.  

## 2023-08-03 NOTE — ED Provider Notes (Signed)
 [facility] Provider Note    Event Date/Time   First MD Initiated Contact with Patient 08/03/23 1300     (approximate)   History   Chest Pain   HPI  Vincent [name] is a 63 y.o. male who presents to the ED for evaluation of Chest Pain   I reviewed PCP visit from last month.  History of diabetes and chronic right sided pain  Patient presents to the ED for a "another opinion" for his chronic right sided pain that has been present for at least a couple years.  Reports constant pain in the right side without waxing and waning.  No shortness of breath, cough, fever, syncope, emesis, stool or urinary changes.  He is tolerating p.o. intake is baseline.  History and exam facilitated by video Swahili interpreter   Physical Exam   Triage Vital Signs: ED Triage Vitals  Encounter Vitals Group     BP 08/03/23 1115 118/89     Systolic BP Percentile --      Diastolic BP Percentile --      Pulse Rate 08/03/23 1115 67     Resp 08/03/23 1115 18     Temp 08/03/23 1112 98.2 F (36.8 C)     Temp Source 08/03/23 1112 Oral     SpO2 08/03/23 1115 96 %     Weight --      Height --      Head Circumference --      Peak Flow --      Pain Score 08/03/23 1109 9     Pain Loc --      Pain Education --      Exclude from Growth Chart --     Most recent vital signs: Vitals:   08/03/23 1115 08/03/23 1407  BP: 118/89 125/77  Pulse: 67 65  Resp: 18 16  Temp:    SpO2: 96% 96%    General: Awake, no distress.  CV:  Good peripheral perfusion.  Resp:  Normal effort.  Abd:  No distention.  Soft and benign throughout without tenderness, guarding or peritoneal features MSK:  No deformity noted.  Neuro:  No focal deficits appreciated. Other:     ED Results / Procedures / Treatments   Labs (all labs ordered are listed, but only abnormal results are displayed) Labs Reviewed  BASIC METABOLIC PANEL WITH GFR - Abnormal; Notable for the following components:       Result Value   Glucose, Bld 130 (*)    Calcium 8.5 (*)    All other components within normal limits  CBC  LIPASE, BLOOD  HEPATIC FUNCTION PANEL  URINALYSIS, ROUTINE W REFLEX MICROSCOPIC  TROPONIN I (HIGH SENSITIVITY)    EKG Sinus rhythm with a rate of 74 bpm.  Leftward axis and normal intervals.  No clear signs of acute ischemia.  RADIOLOGY CXR interpreted by me without evidence of acute cardiopulmonary pathology.  Official radiology report(s): DG Chest 2 View Result Date: 08/03/2023 CLINICAL DATA:  Chest pain for 2 years. EXAM: CHEST - 2 VIEW COMPARISON:  March 11, 2022. FINDINGS: The heart size and mediastinal contours are within normal limits. Both lungs are clear. The visualized skeletal structures are unremarkable. IMPRESSION: No active cardiopulmonary disease. Electronically Signed   By: Rosalene Colon M.D.   On: 08/03/2023 11:47    PROCEDURES and INTERVENTIONS:  .1-3 Lead EKG Interpretation  Performed by: Arline Bennett, MD Authorized by: Arline Bennett, MD     Interpretation: normal  ECG rate:  70   ECG rate assessment: normal     Rhythm: sinus rhythm     Ectopy: none     Conduction: normal     Medications  oxyCODONE  (Oxy IR/ROXICODONE ) immediate release tablet 5 mg (5 mg Oral Patient Refused/Not Given 08/03/23 1422)  acetaminophen  (TYLENOL ) tablet 1,000 mg (1,000 mg Oral Given 08/03/23 1421)     IMPRESSION / MDM / ASSESSMENT AND PLAN / ED COURSE  I reviewed the triage vital signs and the nursing notes.  Differential diagnosis includes, but is not limited to, ACS, PTX, PNA, muscle strain/spasm, PE, dissection, anxiety, pleural effusion  {Patient presents with symptoms of an acute illness or injury that is potentially life-threatening.  Patient presents with chronic right sided discomfort with a normal exam, benign workup and suitable for outpatient management.  Reports the symptoms have been extensively investigated by his PCP, including a CT scan.  Reports  continued symptoms and asking for treatment to fix this.  Has a normal CBC, metabolic panel, troponin, LFTs, imaging and exam.  We discussed following up with his PCP and ED return precautions.      FINAL CLINICAL IMPRESSION(S) / ED DIAGNOSES   Final diagnoses:  Chronic pain syndrome     Rx / DC Orders   ED Discharge Orders     None        Note:  This document was prepared using Dragon voice recognition software and may include unintentional dictation errors.   Arline Bennett, MD 08/03/23 [phone]

## 2023-08-03 NOTE — ED Triage Notes (Signed)
 Interpretor on a stick used during triage. Language Swahili. Vincent [name]  Patient c/o lower, right abdominal pain that radiates up to chest area X2 years

## 2023-08-03 NOTE — ED Notes (Signed)
 Patient was concerned that he would be too sleepy to call for transport for he and his wife to get home.

## 2023-08-29 ENCOUNTER — Other Ambulatory Visit

## 2023-09-04 ENCOUNTER — Telehealth: Payer: Self-pay | Admitting: Urology

## 2023-09-04 NOTE — Telephone Encounter (Signed)
 He called in asking if we can call his Tadalafil  to [facility] on [address].

## 2023-09-05 ENCOUNTER — Ambulatory Visit: Payer: Medicaid Other | Admitting: Urology

## 2023-09-05 ENCOUNTER — Encounter: Payer: Self-pay | Admitting: Urology

## 2023-09-05 DIAGNOSIS — N529 Male erectile dysfunction, unspecified: Secondary | ICD-10-CM | POA: Diagnosis not present

## 2023-09-05 MED ORDER — SILDENAFIL CITRATE 100 MG PO TABS: 100.0000 mg | ORAL_TABLET | Freq: Every day | ORAL | 3 refills | Status: DC | PRN

## 2023-09-05 MED ORDER — TADALAFIL 20 MG PO TABS: ORAL_TABLET | ORAL | 3 refills | Status: DC

## 2023-09-05 NOTE — Telephone Encounter (Signed)
 Pt seen in office today.   SM refilled meds.

## 2023-09-05 NOTE — Progress Notes (Signed)
 09/05/2023 2:02 PM   Vincent [name] 11-07-1960 [id number]  Referring provider: Mimi Alt, MD [address] [place],  [place] [address]  Urological history: 1. BPH with LU TS  -PSA (04/2023) 1.3  2. Renal cyst -CT (03/2023) - 15 mm right renal cyst   Chief Complaint  Patient presents with   Follow-up   HPI: Vincent [name] is a 63 y.o. male who presents today for follow with interpreter, [place]oph[place].   Previous records reviewed.   SHIM 10   He retried the sildenafil  100 mg, on-demand-dosing and it worked well for him.  He has not tried the tadalafil  20 mg, on-demand-dosing.  He is not having spontaneous erections.  He denies any pain or curvature with erections.     SHIM     Row Name 09/05/23 1339         SHIM: Over the last 6 months:   How do you rate your confidence that you could get and keep an erection? Very Low     When you had erections with sexual stimulation, how often were your erections hard enough for penetration (entering your partner)? Sometimes (about half the time)     During sexual intercourse, how often were you able to maintain your erection after you had penetrated (entered) your partner? A Few Times (much less than half the time)     During sexual intercourse, how difficult was it to maintain your erection to completion of intercourse? Extremely Difficult     When you attempted sexual intercourse, how often was it satisfactory for you? Sometimes (about half the time)       SHIM Total Score   SHIM 10              Score: 1-7 Severe ED 8-11 Moderate ED 12-16 Mild-Moderate ED 17-21 Mild ED 22-25 No ED    PMH: Past Medical History:  Diagnosis Date   Chronic bilateral low back pain 12/05/2022   Diabetes mellitus without complication (HCC) 11/20/2022   Groin pain, right 10/13/2022   H. 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   07/07/21 Positive T-Spot by [facility]     Right sided abdominal pain 10/13/2022   Right testicular pain 03/15/2023   Swahili language interpreter needed 11/20/2022    Surgical History: Past Surgical History:  Procedure Laterality Date   COLONOSCOPY WITH PROPOFOL  N/A 06/27/2023   Procedure: COLONOSCOPY WITH PROPOFOL ;  Surgeon: Luke Salaam, MD;  Location: [facility] ENDOSCOPY;  Service: Gastroenterology;  Laterality: N/A;   denies     ESOPHAGOGASTRODUODENOSCOPY (EGD) WITH PROPOFOL  N/A 06/27/2023   Procedure: ESOPHAGOGASTRODUODENOSCOPY (EGD) WITH PROPOFOL ;  Surgeon: Luke Salaam, MD;  Location: [facility] ENDOSCOPY;  Service: Gastroenterology;  Laterality: N/A;  Swahili interpreter - Wife with [employer] at same time   NO PAST SURGERIES      Home Medications:  Allergies as of 09/05/2023   No Known Allergies      Medication List        Accurate as of Sep 05, 2023  2:02 PM. If you have any questions, ask your nurse or doctor.          Accu-Chek Guide Test test strip Generic drug: glucose blood Use as instructed   Accu-Chek Softclix Lancets lancets Use as instructed   benzonatate  100 MG capsule Commonly known as: TESSALON  Take 1 capsule (100 mg total) by mouth 2 (two) times daily  as needed for cough.   Blood Glucose Monitoring Suppl Devi 1 each by Does not apply route in the morning, at noon, and at bedtime. May substitute to any manufacturer covered by patient's insurance.   Accu-Chek Guide w/Device Kit See admin instructions.   DULoxetine  60 MG capsule Commonly known as: Cymbalta  Take 1 capsule (60 mg total) by mouth daily.   DULoxetine  30 MG capsule Commonly known as: Cymbalta  Take 1 capsule (30 mg total) by mouth daily.   gabapentin  300 MG capsule Commonly known as: NEURONTIN  Take 1 capsule (300 mg total) by mouth at bedtime.   Janumet  XR 50-1000 MG Tb24 Generic drug: SitaGLIPtin-MetFORMIN  HCl Take 1 tablet by mouth in the  morning and at bedtime.   pantoprazole  40 MG tablet Commonly known as: PROTONIX  Take 1 tablet (40 mg total) by mouth 2 (two) times daily for 14 days.   polyethylene glycol powder 17 GM/SCOOP powder Commonly known as: GLYCOLAX /MIRALAX  Take 17 g by mouth 2 (two) times daily as needed.   senna 8.6 MG tablet Commonly known as: SENOKOT Take 1 tablet (8.6 mg total) by mouth 2 (two) times daily.   sildenafil  100 MG tablet Commonly known as: VIAGRA  Take 1 tablet (100 mg total) by mouth daily as needed for erectile dysfunction. Started by: Josafat Enrico   tadalafil  20 MG tablet Commonly known as: CIALIS  Take one tablet 30 minutes prior to intercourse What changed: Another medication with the same name was removed. Continue taking this medication, and follow the directions you see here. Changed by: Matilde Son        Allergies: No Known Allergies  Family History: No family history on file.  Social History:  reports that he quit smoking about 11 years ago. His smoking use included cigarettes. He started smoking about 46 years ago. He has never used smokeless tobacco. He reports that he does not currently use alcohol. He reports that he does not use drugs.  ROS: Pertinent ROS in HPI  Physical Exam: BP 117/82   Pulse 80   Ht 5\' 7"  (1.702 m)   Wt 151 lb 11.2 oz (68.8 kg)   BMI 23.76 kg/m   Constitutional:  Well nourished. Alert and oriented, No acute distress. HEENT: [place] AT, moist mucus membranes.  Trachea midline Cardiovascular: No clubbing, cyanosis, or edema. Respiratory: Normal respiratory effort, no increased work of breathing. Neurologic: Grossly intact, no focal deficits, moving all 4 extremities. Psychiatric: Normal mood and affect.   Laboratory Data: N/A   Pertinent Imaging: N/A  Assessment & Plan:    1. ED -at goal with Viagra  100 mg, on-demand-dosing - He would still like the opportunity to try the tadalafil  20 mg in case it is more effective, I sent  refills in for that as well -I advised him not to take the Viagra  and the Cialis  on the same day and he voices understanding   Return in about 6 months (around 03/07/2024) for SHIM, I PSS . Per patient's request   These notes generated with voice recognition software. I apologize for typographical errors.  Briant Camper  [facility] [address]  [address] [place], [place] [address] [phone]

## 2023-09-17 ENCOUNTER — Ambulatory Visit: Admitting: Family Medicine

## 2023-10-10 ENCOUNTER — Other Ambulatory Visit

## 2023-10-18 ENCOUNTER — Ambulatory Visit
Admission: RE | Admit: 2023-10-18 | Discharge: 2023-10-18 | Disposition: A | Discharge status: Home / Self Care | Source: Ambulatory Visit | Attending: Gastroenterology | Admitting: Gastroenterology

## 2023-10-18 DIAGNOSIS — K562 Volvulus: Secondary | ICD-10-CM

## 2023-11-07 ENCOUNTER — Telehealth: Payer: Self-pay | Admitting: Family Medicine

## 2023-11-07 DIAGNOSIS — E119 Type 2 diabetes mellitus without complications: Secondary | ICD-10-CM

## 2023-11-07 NOTE — Telephone Encounter (Unsigned)
 Copied from CRM [id number]. Topic: Clinical - Medication Refill >> Nov 07, 2023 11:31 AM Emylou G wrote: Medication: glucose blood (ACCU-CHEK GUIDE TEST) test strip  Has the patient contacted their pharmacy? Yes (Agent: If no, request that the patient contact the pharmacy for the refill. If patient does not wish to contact the pharmacy document the reason why and proceed with request.) (Agent: If yes, when and what did the pharmacy advise?) said to call us   This is the patient's preferred pharmacy:  [facility] [address] [place] - [address] [place] [address] Phone: [phone] Fax: [phone]  Is this the correct pharmacy for this prescription? Yes If no, delete pharmacy and type the correct one.   Has the prescription been filled recently? No  Is the patient out of the medication? Yes  Has the patient been seen for an appointment in the last year OR does the patient have an upcoming appointment? Yes  Can we respond through MyChart? No  Agent: Please be advised that Rx refills may take up to 3 business days. We ask that you follow-up with your pharmacy.

## 2023-11-08 NOTE — Telephone Encounter (Signed)
 Requested medication (s) are due for refill today- no  Requested medication (s) are on the active medication list -yes  Future visit scheduled -no  Last refill: 06/14/23 #200 12 RF  Notes to clinic: no protocol listed for this request- sent to office for review of request, should have RF on file-1 yr supply  Requested Prescriptions  Pending Prescriptions Disp Refills   glucose blood (ACCU-CHEK GUIDE TEST) test strip 200 each 12    Sig: Use as instructed     There is no refill protocol information for this order       Requested Prescriptions  Pending Prescriptions Disp Refills   glucose blood (ACCU-CHEK GUIDE TEST) test strip 200 each 12    Sig: Use as instructed     There is no refill protocol information for this order

## 2023-11-14 ENCOUNTER — Other Ambulatory Visit: Payer: Self-pay | Admitting: Family Medicine

## 2023-11-14 DIAGNOSIS — E119 Type 2 diabetes mellitus without complications: Secondary | ICD-10-CM

## 2023-11-14 DIAGNOSIS — Z758 Other problems related to medical facilities and other health care: Secondary | ICD-10-CM

## 2023-11-14 NOTE — Telephone Encounter (Signed)
 Copied from CRM #[id number]. Topic: Clinical - Medication Refill >> Nov 14, 2023 10:46 AM Myrick T wrote: Medication: Blood Glucose Monitoring Suppl (ACCU-CHEK GUIDE) w/Device, glucose blood (ACCU-CHEK GUIDE TEST) test strip and SitaGLIPtin-MetFORMIN  HCl (JANUMET  XR) 50-1000 MG TB24   Has the patient contacted their pharmacy? No  This is the patient's preferred pharmacy:  [facility] [address], [place] - [address] [place] [address] Phone: [phone] Fax: [phone]  Is this the correct pharmacy for this prescription? Yes  Has the prescription been filled recently? No  Is the patient out of the medication? Yes  Has the patient been seen for an appointment in the last year OR does the patient have an upcoming appointment? Yes  Can we respond through MyChart? No  Agent: Please be advised that Rx refills may take up to 3 business days. We ask that you follow-up with your pharmacy.  Patient needs supplies as he is out of everything he needs to check his sugar

## 2023-11-16 ENCOUNTER — Telehealth: Payer: Self-pay

## 2023-11-16 NOTE — Progress Notes (Signed)
 Complex Care Management Note  Care Guide Note 11/16/2023 Name: [name] MRN: [id number] DOB: 1960-12-23  [name] is a 63 y.o. year old male who sees Simmons-Robinson, Rockie, MD for primary care. I reached out to [facility] by phone today to offer complex care management services.  [name] was given information about Complex Care Management services today including:   The Complex Care Management services include support from the care team which includes your Nurse Care Manager, Clinical Social Worker, or Pharmacist.  The Complex Care Management team is here to help remove barriers to the health concerns and goals most important to you. Complex Care Management services are voluntary, and the patient may decline or stop services at any time by request to their care team member.   Complex Care Management Consent Status: Patient agreed to services and verbal consent obtained.   Follow up plan:  11/23/23 @ 11 AM.   Encounter Outcome:  Patient Scheduled

## 2023-11-16 NOTE — Progress Notes (Signed)
   Telephone encounter was:  Unsuccessful.  11/16/2023 Name: Jahden Argo MRN: [id number] DOB: [birth year]  Unsuccessful outbound call made today to assist with:  Food Insecurity and Financial Difficulties related to financial strain  Outreach Attempt:  1st Attempt  No answer and unable to leave a message   Jon Colt [facility]  [facility] Guide, Phone: [phone] Fax: [phone] Website: [email or web address]

## 2023-11-20 ENCOUNTER — Telehealth: Payer: Self-pay

## 2023-11-20 NOTE — Progress Notes (Signed)
   Telephone encounter was:  Unsuccessful.  11/20/2023 Name: Vincent [name] MRN: [id number] DOB: 04/22/1960  Unsuccessful outbound call made today to assist with:  Food Insecurity, Financial Difficulties related to financial strain, and [employer] Attempt:  2nd Attempt  No answer and unable to leave a message    Jon Colt [facility]  [facility] Guide, Phone: [phone] Fax: [phone] Website: [email or web address]

## 2023-11-22 ENCOUNTER — Telehealth: Payer: Self-pay

## 2023-11-22 NOTE — Progress Notes (Signed)
   Telephone encounter was:  Unsuccessful.  11/22/2023 Name: Vincent [name] MRN: [id number] DOB: 1960-07-01  Unsuccessful outbound call made today to assist with:  Transportation Needs , Food Insecurity, and Financial Difficulties related to financial strain  Outreach Attempt:  3rd Attempt.  Referral closed unable to contact patient.  No answer and unable to leave a message. I have mailed resources    Jon Colt [facility] Guide, Phone: [phone] Fax: [phone] Website: [email or web address]

## 2023-11-23 ENCOUNTER — Other Ambulatory Visit: Payer: Self-pay

## 2023-11-23 DIAGNOSIS — Z139 Encounter for screening, unspecified: Secondary | ICD-10-CM

## 2023-11-23 DIAGNOSIS — Z79899 Other long term (current) drug therapy: Secondary | ICD-10-CM

## 2023-11-23 NOTE — Patient Instructions (Signed)
 Visit Information  Vincent Vincent [name]. Vincent Vincent [name]   Ikiwa ungependa kuratibu usafiri kupitia mpango wako wa Healthy [employer] Medicaid, tafadhali piga nambari ifuatayo angalau siku 2 Vincent Vincent [name] Vincent Vincent [name]: [phone]  [facility] kuhusu safari yako baada ya kuisanidi, piga simu ya Ride Assist kwa [phone]. Vincent Vincent [name] ili Vincent Vincent [name], au ikiwa usafiri wako umechelewa kuchukua kwa muda ulioratibiwa. Tumia nambari [name], pia, ikiwa unahitaji kufanya mabadiliko au kughairi uhifadhi ulioratibiwa hapo Vincent Vincent [name].  Vincent Vincent [name] unahitaji huduma za usafiri Vincent Vincent [name], Vincent [name] simu [phone]. Kituo cha simu za baada ya saa moja kina wafanyikazi wa saa 24 kushughulikia usaidizi wa safari na maombi ya dharura ya kuhifadhi (pamoja na kutokwa) siku 365 kwa mwaka. Safari za haraka ni pamoja na kutembelea wagonjwa, maombi ya kuruhusiwa kutoka hospitalini na matibabu ya kudumu.  Piga Line ya [place] wa Afya ya Vincent Vincent [name] [phone], wakati wowote, saa 24 kwa siku, siku 7 kwa wiki. Vincent Vincent [name] kelle hatari au unahitaji matibabu ya haraka piga 911.  Vincent Vincent [name] was given information about Medicaid Managed Care team care coordination services as a part of their Healthy [facility] Medicaid benefit. Vincent Vincent [name]   If you would like to schedule transportation through your Healthy [facility] plan, please [name] the following number at least 2 days in advance of your appointment: [phone]  For information about your ride after you set it up, [name] Ride Assist at [phone]. Use this number to activate a Will [name] pickup, or if your transportation is late for a scheduled pickup. Use this number, too, if you need to make a change or cancel a previously scheduled reservation.  If you need transportation services right away, [name] [phone]. The after-hours [name] center is  staffed 24 hours to handle ride assistance and urgent reservation requests (including discharges) 365 days a year. Urgent trips include sick visits, hospital discharge requests and life-sustaining treatment.  [name] the [facility] Line at [phone], at any time, 24 hours a day, 7 days a week. If you are in danger or need immediate medical attention [name] 911.  Care Plan: Msaada wa Usimamizi wa Magonjwa Sugu na Mahitaji ya Elimu Kuhusiana na Vincent [name] cha Aina ya Pili (DMII) Lengo: Ndani ya siku 90 zijazo, Vincent Vincent [name] atahudhuria miadi yote ya matibabu iliyopangwa: kama itakavyothibitishwa kupitia mapitio ya rekodi Vincent Vincent [name] kufanya kazi na Meneja wa Huduma ya RN na/au Mfanyakazi wa Jamii kushughulikia mahitaji ya usimamizi wa huduma na uratibu wa huduma zinazohusiana na DMII, kama itakavyothibitishwa na kuhudhuria miadi ya timu ya usimamizi wa huduma [place] dawa zote kama zilivyoagizwa na atawasiliana na mtoa huduma kwa maswali yanayohusiana na dawa, kama itakavyothibitishwa kwa kuripoti kufuata maagizo ya dawa na kupiga simu kwa duka la dawa siku 7 kabla ya dawa Vincent Vincent [name] kwa maneno uelewa wa msingi wa mchakato wa ugonjwa wa DMII na mpango wa kujisimamia afya, kama itakavyothibitishwa kwa kupima sukari ya damu asubuhi (FBG) na jioni (eve BG) na kurekodi kila siku, kupeleka matokeo kwa daktari wa msingi (PCP), [place] simu kwa mtoa huduma ikiwa kuna viwango vya juu au vya chini vya sukari au maswali/wasiwasi wowote, kufuata lishe ya Vincent [name], kufanya mazoezi Mikakati ya Uingiliaji: Vincent [name] Kutathmini uelewa wa Vincent Vincent [name] kuhusu lengo la A1c: <7% Kutoa elimu kwa Vincent Vincent [name] kuhusu mchakato wa msingi wa Vincent Vincent [name] Kupitia dawa na kujadili umuhimu wa kufuata maagizo ya dawa Kushauri Vincent Vincent [name], kwa kutoa elimu na sababu, kupima sukari ya damu asubuhi  na jioni na kurekodi, kupiga simu kwa mtoa huduma kwa matokeo nje ya viwango vilivyowekwa Kufanya rufaa kwa timu ya duka la dawa kwa  msaada wa malipo ya dawa Kufanya rufaa kwa timu ya kazi ya kijamii kwa msaada wa mahitaji changamano ya SDOH Mapitio ya hali ya Vincent Vincent [name], [place] ni pamoja na ripoti za wataalamu, matokeo ya maabara na vipimo vingine vinavyohusiana, na dawa Uchunguzi wa dalili za msongo wa mawazo unaohusiana na hali ya ugonjwa sugu Kutathmini vikwazo vya viashiria vya kijamii vya afya Matokeo ya Maabara Kipengele: HGBA1C Thamani: 7.7 (Juu) Tarehe: 06/14/2023 Shughuli za Kujitunza za Vincent Vincent [name]: Kuhudhuria miadi yote ya mtoa huduma Kupiga simu kwa duka la dawa Vincent Vincent [name] ya kujaza dawa siku 3-7 kabla ya Vincent Kupiga simu kwa ofisi ya mtoa huduma kwa maswali au wasiwasi mpya [place] dawa kama zilivyoagizwa Kufanya kazi na mfanyakazi wa kijamii kushughulikia mahitaji ya uratibu wa huduma na Vincent Vincent [name] kufanya kazi na timu ya kliniki kushughulikia mahitaji ya usimamizi wa afya na ugonjwa Kupima sukari ya damu kwa nyakati zilizopangwa: asubuhi na jioni kama ilivyoagizwa [place] miguu kila siku kwa vidonda, majeraha au wekundu [place] matokeo ya sukari ya damu na dawa/insulin katika daftari la kila siku [place] daftari la sukari ya damu kwenye miadi yote ya daktari Mpango: Miadi ya ufuatiliaji kwa njia ya simu na mshiriki wa timu ya usimamizi wa huduma imepangwa kwa: 19/11/2023 saa 7:30 mchana   Vincent Vincent [name] - following are the goals we discussed in your visit today:   Goals Addressed             This Visit's Progress    VBCI RN Care Plan - DM       Problems:  Chronic Disease Management support and education needs related to DMII  Goal: Over the next 90 days the Patient will attend all scheduled medical appointments: Patient will attend all appointments as evidenced by chart review        continue to work with RN Care Manager and/or Social Worker to address care management and care coordination needs related to DMII as evidenced by adherence to care management team scheduled appointments     take all  medications exactly as prescribed and will [name] provider for medication related questions as evidenced by reporting compliance with medications and calling pharmacy 7 days prior to running out so no missed medications    verbalize basic understanding of DMII disease process and self health management plan as evidenced by checking FBG and eve BG and recording daily, take readings to PCP appointment, [name] provider if having high or low BG or any questions or concerns, follow diabetic diet, exercise  Interventions:   Diabetes Interventions: Assessed patient's understanding of A1c goal: <7% Provided education to patient about basic DM disease process Reviewed medications with patient and discussed importance of medication adherence Advised patient, providing education and rationale, to check cbg fasting in morning and evening and record, calling Primary Care provider for findings outside established parameters Referral made to pharmacy team for assistance with copays Referral made to social work team for assistance with Complex SDOH needs Review of patient status, including review of consultants reports, relevant laboratory and other test results, and medications completed Screening for signs and symptoms of depression related to chronic disease state  Assessed social determinant of health barriers Lab Results  Component Value Date   HGBA1C 7.7 (H) 06/14/2023    Patient Self-Care Activities:  Attend all scheduled provider appointments [name] pharmacy for medication refills 3-7 days in advance of running out of medications [name]  provider office for new concerns or questions  Take medications as prescribed   Work with the social worker to address care coordination needs and will continue to work with the clinical team to address health care and disease management related needs check blood sugar at prescribed times: fasting in morning and evening as per provider check feet daily for cuts, sores or  redness enter blood sugar readings and medication or insulin into daily log take the blood sugar log to all doctor visits  Plan:  Telephone follow up appointment with care management team member scheduled for:  11/27/2023 at 1:30 pm            Tafadhali tazama nyenzo za elimu zinazohusiana na Vincent [name] zinazotolewa kama nyenzo za uchapishaji.   Vincent Vincent [name] alisema uelewa wa maagizo, nyenzo za kielimu, na mpango wa [place] uliotolewa leo na akakubali kupokea nakala iliyotumwa ya maagizo ya [place], nyenzo za kielimu na mpango wa utunzaji.   Miadi ya kufuatilia kwa simu na mshiriki wa timu ya usimamizi wa utunzaji wa Medicaid iliyoratibiwa: 11/27/2023 saa 1:30 jioni ili kukamilisha ziara ya kwanza. ________________________________________  Please see education materials related to Diabetes provided as print materials.   The patient verbalized understanding of instructions, educational materials, and care plan provided today and agreed to receive a mailed copy of patient instructions, educational materials, and care plan.   Telephone follow up appointment with Managed Medicaid care management team member scheduled for: 11/27/2023 at 1:30 pm to complete initial visit.   Nestora Duos, MSN, RN [facility], [facility] RN Care Manager Direct Dial: [phone] Fax: [phone]  Diabetes Action Plan: Mpango Kazi wa Vincent [name] Mpango wa utekelezaji wa ugonjwa wa Vincent [name] ni njia kwako ya kudhibiti dalili zako za ugonjwa wa Vincent [name], pia huitwa Vincent [name] mellitus. Mpango huu umewekwa kwa rangi ili [place] kuhusu hatua za bermuda na dalili zozote unazo nazo.  Ikiwa una dalili katika eneo nyekundu, unahitaji huduma ya matibabu mara moja.  Vincent una dalili Vincent Vincent [name] eneo la [place], Vincent Vincent [name] wako wa Vincent [name] hauwezi kudhibitiwa, na unaweza kuhitaji kufanya mabadiliko fulani.  Vincent una dalili katika eneo la kijani kibichi, unaendelea vizuri. Kuelewa  ugonjwa wa Vincent [name] kunaweza kuchukua muda. Fuata mpango wa matibabu iran na mtoa huduma wako wa afya. Jua kiwango kinacholengwa cha sukari yako ya damu, pia huitwa glukosi. [place] mpango wako kila wakati unapomtembelea mtoa [place]. Kiwango kinacholengwa cha kiwango changu cha sukari katika damu ni ___________________________________ mg/dL. Monette nyekundu   Pata msaada wa matibabu mara moja ikiwa una mojawapo ya dalili zifuatazo:  Matokeo ya mtihani wa sukari ya damu ambayo ni chini ya 54 mg/dL (3 mmol/L).  Matokeo ya mtihani wa sukari ya damu ambayo iko katika au zaidi ya 240 mg/dL (86.6 mmol/L) kwa siku 2 mfululizo pamoja na:  o Kiu kali na kukojoa mara kwa gonzalo malva Hotter au shida ya kufikiri vizuri.  o [place] cha wastani au [place] cha ketone kwenye mkojo wako (mkojo).  o Kuhisi uchovu au kukosa nguvu.  Kupumua kwa shida.  Ugonjwa au homa kwa siku 2 au zaidi ambayo si bora. Dalili hizi zinaweza kuwa dharura. Piga 911 mara moja.  Usisubiri kuona kama dalili zitaisha.  Usijiendeshe mwenyewe hospitalini. Vincent una sukari ya chini sana ya damu, pia huitwa hypoglycemia kali, na huwezi kula au [place], chad kuhitaji glucagon. [facility] mwanafamilia au rafiki wa karibu anajua jinsi ya kuangalia sukari yako ya damu na jinsi ya kukupa glucagon. Unaweza kuhitaji kutibiwa hospitalini kwa hali [name]. [place]ette wa njano  Ikiwa una mojawapo ya dalili  zifuatazo, ugonjwa wako wa Vincent [name] hauwezi kudhibitiwa, na huenda ukahitaji kufanya mabadiliko fulani:  Matokeo ya mtihani wa sukari ya damu ambayo ni juu au zaidi ya 240 mg/dL (13.3 mmol/L) kwa siku 2 mfululizo.  Matokeo ya mtihani wa sukari ya damu ambayo yako chini ya 70 mg/dL (3.9 mmol/L).  Dalili zingine za hypoglycemia, kama vile:  o [place] au kuhisi kichwa chepesi.  o [place]any[place] au [place].  o [place] njaa.  o Kuwa na mapigo ya moyo haraka. Vincent query dalili zozote za ukanda wa manjano:  Tibu hypoglycemia yako kwa kula  au Merkel gramu 15 za wanga inayofanya [place]. Fuata kanuni ya 15:15:  o Alfonza gramu 15 za wanga inayofanya jodean haggis, kama vile:   ? 1 tube ya gel ya glucose.   ? Vidonge 4 vya sukari.   ? 4 oz (120 mL) ya juisi ya matunda.   ? 4 oz (120 mL) ya soda ya kawaida (sio ya chakula).  o Angalia sukari yako ya damu tena dakika 15 baada ya reunion kabohaidreti.  o Ikiwa kipimo cha pili cha sukari kwenye damu bado kiko au chini ya 70 mg/dL (3.9 mmol/L), chukua tena gramu 15 za kabohaidreti.  o Ikiwa sukari yako ya damu haitaongezeka zaidi ya 70 mg/dL (3.9 mmol/L) baada ya majaribio 3, pata usaidizi wa matibabu mara moja.  o Baada ya sukari yako kurudi katika hali ya [place], kula mlo au vitafunio ndani ya saa 1.  Endelea kutumia dawa zako za kila siku kama ulivyoambiwa na mtoa [place].  Angalia sukari yako ya damu mara nyingi zaidi kuliko kawaida.  o Andika matokeo yako.  o Piga simu mtoa huduma wako ikiwa unatatizika [place]ernsey sukari yako ya damu [employer].  Eneo la kijani   Dalili hizi zinamaanisha kuwa unaendelea vizuri na unaweza kuendelea na kile unachofanya ili kudhibiti ugonjwa wako wa Vincent [name]:  Ezzie yako ya damu iko ndani ya kiwango unacholenga. Kwa watu wengi, kiwango cha sukari ya damu kabla ya chakula kinapaswa kuwa 80-130 mg / dL (4.4-7.2 mmol / L).  Unajisikia vizuri, na unaweza kufanya shughuli za kila siku. Ikiwa uko katika eneo la kijani kibichi, endelea kudhibiti [employer]eration wa Vincent [name] kama ulivyoambiwa na mtoa huduma wako. Ili kufanya hivi:  Kula lishe yenye afya.  Fanya mazoezi mara kwa mara.  Angalia sukari yako ya damu [place].  Kunywa dawa zako kama ulivyoambiwa. Mahali pa kupata habari zaidi  Chama cha Vincent [name] cha Marekani (ADA): diabetes.org  Chama cha [place] wa Huduma na Elimu ya Vincent [name] (ADCES): adces.org/diabetes-education-dsmes Taarifa [name] haikusudiwi reunion nafasi ya ushauri uliopewa na mtoa huduma wako wa afya.  Hakikisha unajadili maswali yoyote uliyo nayo na mtoa huduma wako wa afya. Document Revised: 11/15/2022 Document Reviewed: 11/15/2022 Elsevier Patient Education  2024 [employer].

## 2023-11-27 ENCOUNTER — Other Ambulatory Visit: Payer: Self-pay

## 2023-11-27 NOTE — Patient Outreach (Signed)
 Complex Care Management   Visit Note  11/27/2023  Name:  Vincent [name] MRN: [id number] DOB: 02/05/1961  Situation: Referral received for Complex Care Management related to Diabetes with Complications I obtained verbal consent from Patient.  Visit completed with Patient  on the phone  Background:   Past Medical History:  Diagnosis Date   Chronic bilateral low back pain 12/05/2022   Diabetes mellitus without complication (HCC) 11/20/2022   Groin pain, right 10/13/2022   H. 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   07/07/21 Positive T-Spot by [facility]     Right sided abdominal pain 10/13/2022   Right testicular pain 03/15/2023   Swahili language interpreter needed 11/20/2022    Assessment: Patient Reported Symptoms:  Cognitive Cognitive Status: Alert and oriented to person, place, and time, Insightful and able to interpret abstract concepts, Normal speech and language skills Cognitive/Intellectual Conditions Management [RPT]: None reported or documented in medical history or problem list      Neurological Neurological Review of Symptoms: No symptoms reported Neurological Management Strategies: Routine screening  HEENT HEENT Symptoms Reported: No symptoms reported HEENT Management Strategies: Routine screening HEENT Comment: bright light is uncomfortable - relieved with sunglasses    Cardiovascular Cardiovascular Symptoms Reported: Chest pain or discomfort Does patient have uncontrolled Hypertension?: No Cardiovascular Management Strategies: Routine screening, Medication therapy Cardiovascular Comment: chronic right sided chest pain - no new chest pain or discomfort  Respiratory Respiratory Symptoms Reported: Dry cough, Shortness of breath Other Respiratory Symptoms: no change from prevous - chronic dry cough, gasps in pain and describes as  SOB Respiratory Management Strategies: Routine screening  Endocrine Is patient diabetic?: Yes Is patient checking blood sugars at home?: Yes List most recent blood sugar readings, include date and time of day: FBG today 140  also  checks in eve, range for FBG 135-160, taking Janumet     Gastrointestinal Gastrointestinal Symptoms Reported: Abdominal pain or discomfort Additional Gastrointestinal Details: no change - right side stomach pain chronic, constipation managed, below belly button on right side discomfort, started last year no changes Gastrointestinal Management Strategies: Medication therapy    Genitourinary Genitourinary Symptoms Reported: No symptoms reported    Integumentary Integumentary Symptoms Reported: No symptoms reported Skin Management Strategies: Routine screening  Musculoskeletal Musculoskelatal Symptoms Reviewed: Back pain, Muscle pain, Joint pain Additional Musculoskeletal Details: back pain, at times leg pain Musculoskeletal Management Strategies: Routine screening Falls in the past year?: No Number of falls in past year: 1 or less Was there an injury with Fall?: No Fall Risk Category Calculator: 0 Patient Fall Risk Level: Low Fall Risk Patient at Risk for Falls Due to: Other (Comment) Fall risk Follow up: Falls evaluation completed, Falls prevention discussed  Psychosocial Psychosocial Symptoms Reported: No symptoms reported     Quality of Family Relationships: involved, supportive    11/27/2023    PHQ2-9 Depression Screening   Little interest or pleasure in doing things Not at all  Feeling down, depressed, or hopeless Not at all  PHQ-2 - Total Score 0  Trouble falling or staying asleep, or sleeping too much    Feeling tired or having little energy    Poor appetite or overeating     Feeling bad about yourself - or that you are a failure or have let yourself or your family down    Trouble concentrating on things, such as reading the newspaper or watching  television  Moving or speaking so slowly that other people could have noticed.  Or the opposite - being so fidgety or restless that you have been moving around a lot more than usual    Thoughts that you would be better off dead, or hurting yourself in some way    PHQ2-9 Total Score    If you checked off any problems, how difficult have these problems made it for you to do your work, take care of things at home, or get along with other people    Depression Interventions/Treatment      There were no vitals filed for this visit.  Medications Reviewed Today     Reviewed by Devra Lands, RN (Registered Nurse) on 11/27/23 at 1345  Med List Status: <None>   Medication Order Taking? Sig Documenting Provider Last Dose Status Informant  Accu-Chek Softclix Lancets lancets [id number] Yes Use as instructed Simmons-Robinson, [name], MD  Active   benzonatate  (TESSALON ) 100 MG capsule [id number]  Take 1 capsule (100 mg total) by mouth 2 (two) times daily as needed for cough.  Patient not taking: Reported on 11/27/2023   Sharma Coyer, MD  Active   Blood Glucose Monitoring Suppl (ACCU-CHEK GUIDE) w/Device KIT [id number] Yes See admin instructions. [provider]  Active   Blood Glucose Monitoring Suppl DEVI [id number] Yes 1 each by Does not apply route in the morning, at noon, and at bedtime. May substitute to any manufacturer covered by patient's insurance. Ostwalt, Janna, PA-C  Active   DULoxetine  (CYMBALTA ) 30 MG capsule [id number]  Take 1 capsule (30 mg total) by mouth daily.  Patient not taking: Reported on 11/23/2023   Simmons-Robinson, Coyer, MD  Active   DULoxetine  (CYMBALTA ) 60 MG capsule [id number]  Take 1 capsule (60 mg total) by mouth daily.  Patient not taking: Reported on 11/23/2023   Simmons-Robinson, Coyer, MD  Active   gabapentin  (NEURONTIN ) 300 MG capsule [id number]  Take 1 capsule (300 mg total) by mouth at bedtime.  Patient not taking: Reported on 11/23/2023    Simmons-Robinson, Coyer, MD  Active   glucose blood (ACCU-CHEK GUIDE TEST) test strip [id number] Yes Use as instructed Simmons-Robinson, [name], MD  Active   pantoprazole  (PROTONIX ) 40 MG tablet [id number]  Take 1 tablet (40 mg total) by mouth 2 (two) times daily for 14 days.  Patient not taking: Reported on 11/23/2023   [place], PA-C  Expired 05/22/23 2359   polyethylene glycol powder (GLYCOLAX /MIRALAX ) 17 GM/SCOOP powder [id number] Yes Take 17 g by mouth 2 (two) times daily as needed. Simmons-Robinson, [name], MD  Active   senna (SENOKOT) 8.6 MG tablet [id number] Yes Take 1 tablet (8.6 mg total) by mouth 2 (two) times daily. Simmons-Robinson, [name], MD  Active   sildenafil  (VIAGRA ) 100 MG tablet [id number]  Take 1 tablet (100 mg total) by mouth daily as needed for erectile dysfunction.  Patient not taking: Reported on 11/23/2023   Helon Kirsch A, PA-C  Active   SitaGLIPtin-MetFORMIN  HCl (JANUMET  XR) 50-1000 MG TB24 [id number] Yes Take 1 tablet by mouth in the morning and at bedtime. Simmons-Robinson, [name], MD  Active   tadalafil  (CIALIS ) 20 MG tablet [id number]  Take one tablet 30 minutes prior to intercourse  Patient not taking: Reported on 11/23/2023   Helon Kirsch LABOR, PA-C  Active             Recommendation:   PCP Follow-up Continue Current Plan of Care  Follow Up Plan:   Telephone follow-up in 1 month  Lands Devra, MSN, RN  [facility]  [facility], [facility] RN Care Manager Direct Dial: [phone] Fax: [phone]

## 2023-11-27 NOTE — Patient Outreach (Signed)
 Complex Care Management   Visit Note  11/27/2023  Name:  Vincent [name] MRN: [id number] DOB: 01-May-1960  Situation: Referral received for Complex Care Management related to SDOH Barriers:  Housing rent Food insecurity Lack of essential utilities lights and water Financial Resource Strain I obtained verbal consent from Patient.  Visit completed with Patient  on the phone  Background:   Past Medical History:  Diagnosis Date   Chronic bilateral low back pain 12/05/2022   Diabetes mellitus without complication (HCC) 11/20/2022   Groin pain, right 10/13/2022   H. 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 His daughter was working but due to illness she is unable to work. Patient states they do receive 1300 in foodstamps each month, but it does not last. SW and patient agreed for resources for food,rent and utilities to be mailed.  SDOH Interventions    Flowsheet Row Patient Outreach Telephone from 11/27/2023 in [place] POPUL[facility] DEPARTMENT Patient Outreach Telephone from 11/23/2023 in [place] POPULATION HEALTH DEPARTMENT Telephone from 05/21/2023 in [place] POPULATION HEALTH DEPARTMENT  SDOH Interventions     Food Insecurity Interventions -- AMB Referral, Intervention Not Indicated [id number] Referral, Community Resources Provided  [food stamps]  Housing Interventions -- AMB Referral  [BSW referral] Other (Comment)  [Lot of people living htere but food is hard and will apply for crisis electric will mail food banks and application for LIEAP]  Transportation Interventions -- AMB Referral   [Reports missing appointment due to SCAT and never reached after that] SCAT (Research scientist (life sciences)), [employer] Provided  [employer] Interventions [employer] Provided Other (Comment), AMB Referral  [Lacks cash to pay utilities] Other (Comment)  [refered LIEAP program]  Financial Strain Interventions [employer] Provided -- [employer] Provided  [employer] and a referral to [employer] and also mailed information]    Recommendation:   No recommendations at this time  Follow Up Plan:   Telephone follow-up 12/14/23 at 11am  [place], BSW, [name]   [facility] Social Worker, Population Health [phone]

## 2023-11-27 NOTE — Patient Instructions (Addendum)
 Vincent Vincent [name]  Bw. Vincent Vincent [name] alipewa Vincent [name] kuhusu huduma za uratibu wa huduma za timu ya [facility] kama sehemu ya manufaa yao ya [facility] [place]. Vincent Vincent [name]   Ikiwa ungependa kuratibu usafiri kupitia mpango wako wa Healthy [place] Medicaid, tafadhali piga nambari ifuatayo angalau siku 2 Vincent Vincent [name] Vincent Vincent [name]: [phone]  [facility] kuhusu usafiri wako baada ya kuisanidi, piga simu ya Ride Assist kwa [phone]. Vincent Vincent [name] hii ili [employer] Will Call, au ikiwa usafiri wako umechelewa kuchukua kwa muda ulioratibiwa. Tumia nambari hii pia, ikiwa unahitaji kufanya mabadiliko au kughairi uhifadhi ulioratibiwa hapo [place].  [facility] unahitaji huduma za usafiri Vincent Vincent [name], idaho simu [phone]. Kituo cha simu za baada ya saa moja kina wafanyikazi wa saa 24 kushughulikia usaidizi wa safari na maombi ya dharura ya kuhifadhi (pamoja na kutokwa) siku 365 kwa mwaka. Safari za haraka ni pamoja na kutembelea wagonjwa, maombi ya kuruhusiwa kutoka hospitalini na matibabu ya kudumu.  Piga Line ya [place] wa Afya ya Vincent Vincent [name] [phone], wakati wowote, saa 24 kwa siku, siku 7 kwa wiki. Vincent Vincent [name] hatari au unahitaji matibabu ya haraka piga 911.   Vincent Vincent [name] - yafuatayo ni malengo tuliyoyajadili Vincent [name] ziara yako ya leo:  Mpango wa [place] wa VBCI CM / Malengo Vincent Vincent [name]  Mpango wa Huduma ya Usimamizi wa VBCI / Malengo Yaliyoshughulikiwa Katika Mkutano Huu  Malengo Yaliyoshughulikiwa  Maendeleo ya Ziara Hii  Mpango wa Huduma ya Vincent wa VBCI - Kisukari (DM): Inaendelea vizuri Matatizo: Mahitaji ya msaada na elimu kuhusu usimamizi wa magonjwa sugu yanayohusiana na Kisukari Aina ya II (DMII) Lengo:  Katika siku 90 zijazo, mgonjwa atahudhuria miadi yote ya matibabu iliyopangwa: Mgonjwa atahudhuria miadi yote kama inavyoonyeshwa na mapitio ya faili Kuendelea kufanya kazi na Meneja wa [place] ya Vincent na/au Mfanyakazi wa Jamii kushughulikia mahitaji  ya usimamizi na uratibu wa huduma yanayohusiana na DMII kama inavyoonyeshwa na kuhudhuria miadi ya timu ya usimamizi wa huduma [place] dawa zote kama zilivyoagizwa na atawasiliana na mtoa huduma kwa maswali yanayohusiana na dawa kama inavyoonyeshwa na ripoti ya ufuatiliaji wa dawa na kupiga simu kwa duka la dawa siku 7 kabla ya dawa Vincent Vincent [name] ili Vincent Vincent [name] dawa Vincent Vincent [name] kwa maneno uelewa wa msingi wa mchakato wa ugonjwa wa DMII na mpango wa kujisimamia kiafya kama inavyoonyeshwa na kupima sukari ya damu asubuhi na jioni na kurekodi kila siku, kupeleka matokeo kwa daktari, kupiga simu kwa mtoa huduma ikiwa uruguay sukari ya juu au ya chini au maswali/wasiwasi wowote, kufuata lishe ya Crumpler, kufanya mazoezi Mikakati ya Uingiliaji (Interventions):  [name] ya Kisukari:  Kutathmini uelewa wa mgonjwa kuhusu lengo la A1c: <7% Benin elimu kwa mgonjwa kuhusu mchakato wa msingi wa ugonjwa wa kisukari Kupitia dawa na kujadili umuhimu wa kufuata maagizo ya dawa - mgonjwa alisema hakuweza kuwasiliana na duka la dawa, Vincent Vincent [name] kwa PCP kuomba dawa Kushauri mgonjwa, kwa kutoa elimu na sababu, kupima sukari ya damu asubuhi na jioni na kurekodi, kupiga simu kwa mtoa huduma wa msingi kwa matokeo nje ya viwango vilivyowekwa Rufaa kwa timu ya duka la dawa kwa msaada wa malipo ya dawa Rufaa kwa timu ya kazi ya kijamii kwa msaada wa mahitaji magumu ya SDOH Mapitio ya hali ya [name], ikiwa ni pamoja na ripoti za wataalamu, matokeo ya maabara na vipimo vingine husika, na dawa zilizokamilika Uchunguzi wa dalili za msongo wa mawazo unaohusiana na hali ya ugonjwa sugu Kutathmini vikwazo vya viashiria vya kijamii vya afya Vincent [name] kuhusu lishe ya kisukari imejadiliwa na kutumwa kwa barua Mawasiliano yote yametafsiriwa kwa [place], mkalimani alitumiwa kwa simu zote  Tafadhali angalia nyenzo za elimu zinazohusiana na Maelekezo ya Advance na Diet ya Kisukari zinazotolewa kama nyenzo za uchapishaji.   Mgonjwa  alisema uelewa wa maagizo, nyenzo za kielimu, na mpango wa [place] uliotolewa leo na akakubali kupokea nakala iliyotumwa ya maagizo ya [place], nyenzo za kielimu na mpango wa utunzaji.   Miadi ya kufuatilia kwa simu na mshiriki wa timu ya usimamizi wa utunzaji wa Medicaid iliyoratibiwa: 12/25/2023 saa 1:00 jioni.  Wiki [name], MSN, Vincent Afya ya Koni  Taasisi ya Utunzaji wa Thamani, Afya ya Idadi ya Watu [name] Vincent [name] wa Vincent Piga [place]ston marena kingston: [phone] Faksi: [phone]  Maelekezo ya Mapema  Maagizo ya mapema ni karatasi za kisheria zinazoelezea matakwa yako kuhusu maamuzi ya utunzaji wa afya. Wanaruhusu matakwa yako yajulikane kwa familia, marafiki, na wahudumu wa afya ikiwa hutaweza kujieleza.  [name] karatasi hizi kwa muda badala ya zote mara moja. Wanaweza kubadilishwa na kusasishwa wakati wowote. Aina za maagizo ya mapema ni pamoja na:  Medical power of attorney (POA).  Wosia hai.  Usifufue (DNR) au usijaribu maagizo Vincent jo (DNAR). [place] wa huduma ya afya na POA ya matibabu ni nini? [place] wa huduma ya afya pia huitwa wakala wa huduma ya afya. Ni mtu unayemchagua kukufanyia maamuzi ya matibabu wakati huwezi kujifanyia mwenyewe. Katika hali nyingi, wakala ni rafiki au mwanafamilia anayeaminika. POA ya matibabu ni karatasi za kisheria zinazotaja wakala wako. Inaweza kuhitajika kuwa:  Imetiwa saini.  Imethibitishwa.  Tarehe.  Imenakiliwa.  Kushuhudia.  Imeongezwa kwenye rekodi [employer]. Unaweza pia ledon kuchagua mtu wa kushughulikia pesa zako ikiwa huwezi kufanya hivyo. Hii inaitwa POA ya kudumu ya fedha. Ni tofauti na POA ya matibabu. [name] wa huduma ya afya au mtu mwingine kufanya kazi kama wakala wako katika masuala ya pesa. Iwapo berlin alyse, au ikiwa wakala hawezi [name] kwa [name] inaweza kuchagua mlezi kuchukua hatua kwa [place].  Wosia hai ni nini? [name] hai ni makaratasi ya kisheria ambayo  yanasema matakwa yako kuhusu huduma ya matibabu. [place] huduma wanapaswa kuweka nakala yake katika rekodi yako ya matibabu. Unaweza ledon kutoa nakala kwa wanafamilia au marafiki. Unaweza pia kathreen frederica burrow pochi yako ili kuwafahamisha wapendwa wako kwamba una wosia hai na ni wapi wanaweza kuipata. [name] wa vinson laurelyn kutumika ikiwa:  Unaumwa sana na kitu kitakachokatisha maisha yako.  Unakuwa mlemavu.  Huwezi kufanya maamuzi au [employer]. Eddy gasman wa vinson unapaswa kujumuisha kama:  [place]utumia au kutotumia vifaa vya webb pringle. Hii inaweza kujumuisha mashine za kuchuja damu yako au kukusaidia kupumua.  Unataka agizo la DNR au DNAR. Hii inawaambia watoa huduma wasitumie CPR ikiwa moyo wako au kupumua kwako kutaacha.  Kutumia au kutotumia ulishaji wa mirija.  Unataka kupewa vyakula na maji.  [place]ataka aina ya huduma ya faraja inayoitwa palliative care. Hii inaweza kutolewa wakati lengo la matibabu linakuwa faraja badala ya tiba.  [place]aka kutoa viungo na tishu zako. Wosia haisemi nini venezuela na pesa na jordan yako mauritius. DNR au DNAR ni nini? Agizo la DNR au DNAR ni ombi la kutokuwa na CPR. Vincent huna mojawapo ya [employer], utah huduma atajaribu kukusaidia moyo wako ukisimama au [place] kupumua.  Ikiwa unapanga kufanyiwa upasuaji, zungumza na mtoa huduma wako kuhusu agizo lako la DNR au DNAR. Nini kitatokea ikiwa sina maagizo ya mapema? Kila jimbo lina sheria zake kuhusu maagizo ya mapema. Baadhi ya majimbo huwapa wafanya maamuzi ya familia kuchukua hatua kwa niaba yako ikiwa huna maagizo ya mapema.  Wasiliana na mtoa huduma wako, wakili, au [employer]  wa jimbo kuhusu sheria za jimbo lako. Mahali pa kupata habari zaidi Kila jimbo lina sheria zake kuhusu maagizo ya mapema. [name] kos sheria hizi kwa: [email or web address] Vincent [name] hii haikusudiwi reunion nafasi ya ushauri uliopewa na mtoa huduma wako wa afya. Hakikisha unajadili maswali yoyote uliyo nayo na mtoa huduma wako  wa afya. Hati Iliyorekebishwa: 08/14/2022 Hati Ilikaguliwa: 08/14/2022 Elimu Vincent Dauer wa Elsevier  2024 Elsevier Inc.   Kisukari: Gregor Afya kwa Watu Wazima Unapokuwa na ugonjwa wa kisukari, pia huitwa kisukari mellitus, ni muhimu kuwa na tabia ya kula yenye afya. Viwango vya sukari kwenye damu (glucose) huathiriwa sana na kile unachokula na kunywa. Unahitaji kula vyakula vyenye afya kwa kiwango kinachofaa, karibu nyakati sawa kila siku. Xlqjwbj hivi gilberto xlxldjpipj:  Dhibiti sukari yako ya damu.  Punguza hatari yako ya [place] wa moyo.  Kuboresha shinikizo la damu yako.  Fikia au ubaki kwenye uzani wenye afya. Ni nini kinachoweza kuathiri mpango wangu wa chakula? Kila mtu mwenye ugonjwa wa kisukari ni tofauti. Na kila mtu ana mahitaji tofauti ya mpango wa chakula. Mtoa huduma wako wa afya anaweza pennelope se ufanye kazi na mtaalam wa lishe bora anayeitwa mtaalamu wa lishe. [facility] xlxldjpipj kufanya mpango wa chakula unaofaa kwako. Je, wanga huniathirije? [place], pia huitwa wanga, huathiri kiwango cha sukari kwenye damu [place] aina nyingine yoyote sri lanka. Kula wanga huongeza kiwango cha sukari kwenye damu. Ni muhimu kujua ni wanga ngapi unaweza kuwa nao kwa usalama katika kila mlo. Hii ni tofauti kwa kila mtu. Mtaalamu wako wa lishe anaweza kukusaidia kuhesabu wangapi wa wanga unapaswa kuwa katika kila mlo na kwa kila vitafunio. Pombe huniathirije? Pombe inaweza kusababisha kupungua kwa sukari ya damu (hypoglycemia), haswa ikiwa unatumia insulini au reunion dawa fulani za kisukari kwa mdomo. Hypoglycemia inaweza kuwa hali ya samara pringle. Dalili za hypoglycemia ni sawa na zile za kunywa pombe kupita kiasi. Ni pamoja na [place], kusinzia, na kuhisi kizunguzungu.  Usinywe pombe ikiwa:  Mtoa huduma wako anakuambia usinywe.  Una mimba, unaweza kuwa mjamzito, au unapanga kuwa mjamzito. Ni vidokezo vipi vya kufuata mpango huu? Kusoma maandiko ya chakula  Anza kwa  kuangalia ukubwa wa huduma kwenye lebo ya Nutrition Facts ya vyakula na vinywaji vilivyowekwa kwenye pakiti. Idadi ya kalori na kiasi cha wanga, mafuta na virutubishi vingine vilivyoorodheshwa kwenye lebo hutegemea sehemu moja ya bidhaa. Vitu vingi vina huduma zaidi ya moja kwa kila kifurushi.  Angalia jumla ya gramu (g) za wanga katika sehemu moja.  Angalia idadi ya gramu za mafuta yaliyojaa na mafuta ya trans katika sehemu moja. [place] vyakula ambavyo vina kiwango kidogo au hakuna hata mafuta haya.  Angalia idadi ya miligramu (mg) za chumvi (sodiamu) katika sehemu moja. Watu wengi wanapaswa kupunguza ulaji wao wa sodiamu hadi chini ya miligramu 2,300 kwa siku.  Angalia kila mara maelezo ya lishe ya vyakula vilivyoandikwa kama mafuta kidogo au nonfat. Vyakula hivi vinaweza kuwa vya juu katika sukari iliyoongezwa au wanga iliyosafishwa na inapaswa kuepukwa.  Zungumza na mtaalamu wako wa lishe ili kutambua malengo yako ya kila siku ya virutubishi vilivyoorodheshwa kwenye lebo.  Ununuzi  Epuka kununua vyakula vya makopo, vilivyotengenezwa tayari, au vilivyosindikwa. Vyakula hivi huwa na mafuta mengi, sodiamu, na sukari iliyoongezwa.  Nunua ukingo wa nje wa duka la [place]. Hapa ndipo french polynesia nyingi utapata matunda na mboga mboga, nafaka nyingi, nyama safi na bidhaa za maziwa safi. Kupika  Tumia njia za [place] kwa joto la chini, kama vile kuoka, badala ya njia za joto kali kama vile kukaanga kwa kina.  Pika kwa kutumia mafuta yenye afya, kama vile mizeituni, kanola, au  mafuta ya alizeti.  Epuka kupika na siagi, cream, au nyama iliyo na mafuta mengi. Kupanga chakula   Kula chakula na vitafunwa mara kwa mara. Jaribu kula kwa wakati mmoja kila siku. Epuka kwenda kwa muda mrefu bila kula.  Kula vyakula vilivyo na nyuzinyuzi nyingi, kama vile [place], mboga mboga, maharage na nafaka zisizokobolewa.  Kula oz 4-6 (112-168 g) za protini isiyo na mafuta kila siku, kama vile nyama konda, [place], [place], [place] au  tofu. Tjxpj moja (oz) (28 g) ya protini konda ni sawa na:  oz 1 (28 g) ya nyama, kuku, au samaki.  o 1 yai.  o  kikombe (62 g) cha tofu.  Kula baadhi ya vyakula kila siku vyenye mafuta yenye afya, kama vile parachichi, karanga, mbegu na samaki. Je! ninapaswa kula vyakula gani? Matunda Berries. [place]. [place]. [place]. Parachichi. Plum. Zabibu. Maembe. Papai. Makomamanga. Kiwi. Cherries. Mboga Mboga za [place], ikiwa ni pamoja na lettuki, mchicha, kale, chard, mboga za kola, mboga za [place], na kabichi. Beets. Cauliflower. Brokoli. Karoti. Maharage ya kijani. Nyanya. Pilipili. Vitunguu. Matango. Mimea ya [place]. Nafaka Nafaka nzima, kama vile ngano-zima au mkate wa nafaka nzima, crackers, tortilla, nafaka, na pasta. Uji wa oatmeal usio na sukari. Quinoa. Mchele wa [employer]. Nyama na protini zingine [place] cha baharini. Kuku bila ngozi. Kupunguzwa kwa konda kwa kuku na nyama ya ng'ombe. Tofu. Corine. Mbegu. Maziwa Bidhaa za maziwa zisizo na mafuta kidogo au zisizo na mafuta kama vile [place], mtindi na jibini. Bidhaa zilizoorodheshwa hapo juu haziwezi kuwa vyakula na vinywaji vyote unavyoweza kuwa. Zungumza na mtaalamu wa lishe ili kujifunza zaidi. Je, ni vyakula gani ninavyopaswa kuepuka? Matunda Matunda ya makopo na syrup. Mboga Mboga ya makopo. Mboga waliohifadhiwa na siagi au mchuzi wa cream. Nafaka Unga mweupe uliosafishwa na bidhaa za unga kama vile mkate, pasta, vyakula vya vitafunio, na nafaka. Epuka vyakula vyote vilivyosindikwa. Nyama na protini zingine Vipande vya mafuta vya nyama. Kuku na ngozi. Nyama za [employer]. Nyama iliyosindikwa. Epuka mafuta yaliyojaa. Maziwa Mtindi [employer], jibini, au maziwa. Vinywaji Vinywaji vitamu, kama vile soda au chai ya barafu. Vipengee vilivyoorodheshwa hapo juu vinaweza kuwa sio vyakula na vinywaji vyote unapaswa kuepuka. Zungumza na mtaalamu wa lishe ili kujifunza zaidi. Mahali pa kupata habari zaidi: Ili  kujifunza zaidi, nenda kwa:  Vinywaji Vinywaji vitamu, kama vile soda au chai ya barafu. Vipengee vilivyoorodheshwa hapo juu vinaweza kuwa sio vyakula na vinywaji vyote unapaswa kuepuka. Zungumza na mtaalamu wa lishe ili kujifunza zaidi. Mahali pa kupata habari zaidi: Ili kujifunza zaidi, nenda kwa:  Chuo cha Lishe na Dietetics katika [email or web address].  1. Bonyeza Tafuta na chapa kisukari.  2. Tafuta kiungo unachohitaji.  Vituo vya Kudhibiti na Kuzuia Magonjwa katika [email or web address].  1. Bonyeza Tafuta na chapa kisukari.  2. Tafuta kiungo unachohitaji.  Chama cha Kisukari cha Marekani: [email or web address]  Taasisi ya Kitaifa ya Kisukari na Magonjwa Vincent Sarah chakula na Figo: [employer].si Vincent [name] hii haikusudiwi kuchukua nafasi ya ushauri uliopewa na mtoa huduma wako wa afya. Hakikisha unajadili maswali yoyote uliyo nayo na mtoa huduma wako wa afya. Hati Iliyorekebishwa: 03/15/2023 Hati Ilikaguliwa: 03/15/2023 Elimu Vincent Dauer wa Elsevier  [address]  Visit Information  Mr. Scholle was given information about Medicaid Managed Care team care coordination services as a part of their Healthy [facility] Medicaid benefit. Liban Fronek   If you would like to schedule transportation through your Healthy [facility] plan, please call the following number at least 2 days in advance of your appointment: [phone]  For information about your ride after you set it up,  call Ride Assist at [phone]. Use this number to activate a Will Call pickup, or if your transportation is late for a scheduled pickup. Use this number, too, if you need to make a change or cancel a previously scheduled reservation.  If you need transportation services right away, call [phone]. The after-hours call center is staffed 24 hours to handle ride assistance and urgent reservation requests (including discharges) 365 days a year. Urgent trips include sick visits, hospital discharge requests and  life-sustaining treatment.  Call the [facility] Line at [phone], at any time, 24 hours a day, 7 days a week. If you are in danger or need immediate medical attention call 911.   Mr. Woollard - following are the goals we discussed in your visit today:   Goals Addressed             This Visit's Progress    VBCI Vincent Care Plan - DM   On track    Problems:  Chronic Disease Management support and education needs related to DMII  Goal: Over the next 90 days the Patient will attend all scheduled medical appointments: Patient will attend all appointments as evidenced by chart review        continue to work with Vincent Vincent [name] and/or Social Worker to address care management and care coordination needs related to DMII as evidenced by adherence to care management team scheduled appointments     take all medications exactly as prescribed and will call provider for medication related questions as evidenced by reporting compliance with medications and calling pharmacy 7 days prior to running out so no missed medications    verbalize basic understanding of DMII disease process and self health management plan as evidenced by checking FBG and eve BG and recording daily, take readings to PCP appointment, call provider if having high or low BG or any questions or concerns, follow diabetic diet, exercise  Interventions:   Diabetes Interventions: Assessed patient's understanding of A1c goal: <7% Provided education to patient about basic DM disease process Reviewed medications with patient and discussed importance of medication adherence - patient states could not reach pharmacy, message sent to PCP requesting refills  Advised patient, providing education and rationale, to check cbg fasting in morning and evening and record, calling Primary Care provider for findings outside established parameters Referral made to pharmacy team for assistance with copays Referral made to social work  team for assistance with Complex SDOH needs Review of patient status, including review of consultants reports, relevant laboratory and other test results, and medications completed Screening for signs and symptoms of depression related to chronic disease state  Assessed social determinant of health barriers Diabetic diet information discussed and sent via mail  All correspondence translated into Swahili, interpreter used for all calls Lab Results  Component Value Date   HGBA1C 7.7 (H) 06/14/2023    Patient Self-Care Activities:  Attend all scheduled provider appointments Call pharmacy for medication refills 3-7 days in advance of running out of medications Call provider office for new concerns or questions  Take medications as prescribed   Work with the social worker to address care coordination needs and will continue to work with the clinical team to address health care and disease management related needs check blood sugar at prescribed times: fasting in morning and evening as per provider check feet daily for cuts, sores or redness enter blood sugar readings and medication or insulin into daily log take the blood sugar log to all doctor visits  Plan:  Telephone follow up appointment with care management team member scheduled for:  12/25/2023 at 1:00 pm             Please see education materials related to Advance Directives and Diabetic Diet provided as print materials.   The patient verbalized understanding of instructions, educational materials, and care plan provided today and agreed to receive a mailed copy of patient instructions, educational materials, and care plan.   Telephone follow up appointment with Managed Medicaid care management team member scheduled for: 12/25/2023 at 1:00 pm  Nestora Duos, MSN, Vincent [place]  [facility], [facility] Vincent Vincent [name] Direct Dial: [phone] Fax: [phone]  Advance Directive  Advance  directives are legal papers that state your wishes about health care decisions. They let your wishes be known to family, friends, and health care providers if you become unable to speak for yourself.  You should write these papers out over time rather than all at once. They can be changed and updated at any time. The types of advance directives include: Medical power of attorney (POA). Living wills. Do not resuscitate (DNR) or do not attempt resuscitation (DNAR) orders. What are a health care proxy and medical POA? A health care proxy is also called a health care agent. It's a person you choose to make medical decisions for you when you can't make them for yourself. In most cases, a proxy is a trusted friend or family member. A medical POA is legal paperwork that names your proxy. It may need to be: Signed. Notarized. Dated. Copied. Witnessed. Added to your medical record. You may also want to choose someone to handle your money if you can't do so. This is called a durable POA for finances. It's separate from a medical POA. You may choose your health care proxy or someone else to act as your agent in money matters. If you don't have a proxy, or if the proxy may not be acting in your best interest, a court may choose a guardian to act on your behalf. What is a living will? A living will is legal paperwork that states your wishes about medical care. Providers should keep a copy of it in your medical record. You may want to give a copy to family members or friends. You can also keep a card in your wallet to let loved ones know you have a living will and where they can find it. A living will may be used if: You're very sick with something that will end your life. You become disabled. You can't make decisions or speak for yourself. Your living will should include whether: To use or not use life support equipment. This may include machines to filter your blood or to help you breathe. You want a DNR  or DNAR order. This tells providers not to use CPR if your heart or breathing stops. To use or not use tube feeding. You want to be given foods and fluids. You want a type of comfort care called palliative care. This may be given when the goal for treatment becomes comfort rather than a cure. You want to donate your organs and tissues. A living will doesn't say what to do with your money and property if you pass away. What is a DNR or DNAR? A DNR or DNAR order is a request not to have CPR. If you don't have one of these orders, a provider will try to help you if your heart stops or you stop  breathing.  If you plan to have surgery, talk with your provider about your DNR or DNAR order. What happens if I don't have an advance directive? Each state has its own laws about advance directives. Some states assign family decision makers to act on your behalf if you don't have an advance directive.  Check with your provider, attorney, or state representative about the laws in your state. Where to find more information Each state has its own laws about advance directives. You can look up these laws at: [email or web address] This information is not intended to replace advice given to you by your health care provider. Make sure you discuss any questions you have with your health care provider. Document Revised: 08/14/2022 Document Reviewed: 08/14/2022 Elsevier Patient Education  2024 Elsevier Inc.  Diabetes: Healthy Eating for Adults When you have diabetes, also called diabetes mellitus, it's important to have healthy eating habits. Your blood sugar (glucose) levels are greatly affected by what you eat and drink. You need to eat healthy foods in the right amounts, at about the same times each day. Doing this can help you: Manage your blood sugar. Lower your risk of heart disease. Improve your blood pressure. Reach or stay at a healthy weight. What can affect my meal plan? Every person with diabetes is different.  And each person has different needs for a meal plan. Your health care provider may suggest that you work with an expert in healthy eating called a dietitian. They can help you make a meal plan that's best for you. How do carbohydrates affect me? Carbohydrates, also called carbs, affect your blood sugar level more than any other type of food. Eating carbs raises the amount of sugar in your blood. It's important to know how many carbs you can safely have in each meal. This is different for every person. Your dietitian can help you calculate how many carbs you should have at each meal and for each snack. How does alcohol affect me? Alcohol can cause a decrease in blood sugar (hypoglycemia), especially if you use insulin or take certain diabetes medicines by mouth. Hypoglycemia can be a life-threatening condition. Symptoms of hypoglycemia are similar to those of having too much alcohol. They include confusion, being sleepy, and feeling dizzy. Do not drink alcohol if: Your provider tells you not to drink. You're pregnant, may be pregnant, or plan to become pregnant. What are tips for following this plan? Reading food labels Start by checking the serving size on the Nutrition Facts label of packaged foods and drinks. The number of calories and the amount of carbs, fats, and other nutrients listed on the label are based on one serving of the item. Many items contain more than one serving per package. Check the total grams (g) of carbs in one serving. Check the number of grams of saturated fats and trans fats in one serving. Choose foods that have a low amount or none of these fats. Check the number of milligrams (mg) of salt (sodium) in one serving. Most people should limit their total sodium intake to less than 2,300 mg per day. Always check the nutrition information of foods labeled as low-fat or nonfat. These foods may be higher in added sugar or refined carbs and should be avoided. Talk to your  dietitian to identify your daily goals for nutrients listed on the label. Shopping Avoid buying canned, pre-made, or processed foods. These foods tend to be high in fat, sodium, and added sugar. Shop around the outside edge of  the grocery store. This is where you'll most often find fresh fruits and vegetables, bulk grains, fresh meats, and fresh dairy products. Cooking Use low-heat cooking methods, such as baking, instead of high-heat methods like deep frying. Cook using healthy oils, such as olive, canola, or sunflower oil. Avoid cooking with butter, cream, or high-fat meats. Meal planning  Eat meals and snacks regularly. Try to eat them at the same times every day. Avoid going too long without eating. Eat foods that are high in fiber, such as fresh fruits, vegetables, beans, and whole grains. Eat 4-6 oz (112-168 g) of lean protein each day, such as lean meat, chicken, fish, eggs, or tofu. One ounce (oz) (28 g) of lean protein is equal to: 1 oz (28 g) of meat, chicken, or fish. 1 egg.  cup (62 g) of tofu. Eat some foods each day that contain healthy fats, such as avocado, nuts, seeds, and fish. What foods should I eat? Fruits Berries. Apples. Oranges. Peaches. Apricots. Plums. Grapes. Mangoes. Papayas. Pomegranates. Kiwi. Cherries. Vegetables Leafy greens, including lettuce, spinach, kale, chard, collard greens, mustard greens, and cabbage. Beets. Cauliflower. Broccoli. Carrots. Green beans. Tomatoes. Peppers. Onions. Cucumbers. Brussels sprouts. Grains Whole grains, such as whole-wheat or whole-grain bread, crackers, tortillas, cereal, and pasta. Unsweetened oatmeal. Quinoa. Brown or wild rice. Meats and other proteins Seafood. Poultry without skin. Lean cuts of poultry and beef. Tofu. Nuts. Seeds. Dairy Low-fat or fat-free dairy products such as milk, yogurt, and cheese. The items listed above may not be all the foods and drinks you can have. Talk with a dietitian to learn more. What  foods should I avoid? Fruits Fruits canned with syrup. Vegetables Canned vegetables. Frozen vegetables with butter or cream sauce. Grains Refined white flour and flour products such as bread, pasta, snack foods, and cereals. Avoid all processed foods. Meats and other proteins Fatty cuts of meat. Poultry with skin. Breaded or fried meats. Processed meat. Avoid saturated fats. Dairy Full-fat yogurt, cheese, or milk. Beverages Sweetened drinks, such as soda or iced tea. The items listed above may not be all the foods and drinks you should avoid. Talk with a dietitian to learn more. Where to find more information: To learn more, go to: Academy of Nutrition and Dietetics at DeathPrevention.it. Click Search and type diabetes. Find the link you need. Centers for Disease Control and Prevention at TonerPromos.no. Click Search and type diabetes. Find the link you need. American Diabetes Association: diabetes.org/food-nutrition [employer] of Diabetes and Digestive and Kidney Diseases: StageSync.si This information is not intended to replace advice given to you by your health care provider. Make sure you discuss any questions you have with your health care provider. Document Revised: 03/15/2023 Document Reviewed: 03/15/2023 Elsevier Patient Education  2025 [employer].

## 2023-11-27 NOTE — Patient Instructions (Signed)
 Visit Information  [name] was given information about Medicaid Managed Care team care coordination services as a part of their Healthy [facility] Medicaid benefit. Vincent [name]   If you would like to schedule transportation through your Healthy [facility] plan, please call the following number at least 2 days in advance of your appointment: [phone]  For information about your ride after you set it up, call Ride Assist at [phone]. Use this number to activate a Will Call pickup, or if your transportation is late for a scheduled pickup. Use this number, too, if you need to make a change or cancel a previously scheduled reservation.  If you need transportation services right away, call [phone]. The after-hours call center is staffed 24 hours to handle ride assistance and urgent reservation requests (including discharges) 365 days a year. Urgent trips include sick visits, hospital discharge requests and life-sustaining treatment.  Call the [facility] Line at [phone], at any time, 24 hours a day, 7 days a week. If you are in danger or need immediate medical attention call 911.   Vincent [name] - following are the goals we discussed in your visit today:   Goals Addressed             This Visit's Progress    BSW VBCI Social Work Care Plan       Problems:   Corporate treasurer , Geophysicist/field seismologist , and Housing   CSW Clinical Goal(s):   Over the next 30 days the Patient will work with Child psychotherapist to address concerns related to SDOH.  Interventions:  SW mailed resources for food, rent, and utilities  Patient Goals/Self-Care Activities:  Patient will use resources SW mailed to assist with food,rent and utilities.  Plan:   SW will follow up on 12/14/23 at 11am.         Social Worker will follow up on 12/14/23 at 11am.   [name], BSW, [name] [place]  [facility] Social Worker, Population Health [phone]    Following is a copy of your plan of care:  There are no care plans that you recently modified to display for this patient.

## 2023-11-30 ENCOUNTER — Other Ambulatory Visit: Payer: Self-pay | Admitting: Family Medicine

## 2023-11-30 DIAGNOSIS — A048 Other specified bacterial intestinal infections: Secondary | ICD-10-CM

## 2023-11-30 DIAGNOSIS — G8929 Other chronic pain: Secondary | ICD-10-CM

## 2023-11-30 MED ORDER — PANTOPRAZOLE SODIUM 40 MG PO TBEC: 40.0000 mg | DELAYED_RELEASE_TABLET | Freq: Two times a day (BID) | ORAL | 1 refills | Status: DC

## 2023-11-30 MED ORDER — DULOXETINE HCL 60 MG PO CPEP: 60.0000 mg | ORAL_CAPSULE | Freq: Every day | ORAL | 3 refills | Status: DC

## 2023-12-04 ENCOUNTER — Other Ambulatory Visit: Payer: Self-pay | Admitting: Family Medicine

## 2023-12-04 ENCOUNTER — Telehealth: Payer: Self-pay

## 2023-12-04 DIAGNOSIS — E119 Type 2 diabetes mellitus without complications: Secondary | ICD-10-CM

## 2023-12-04 MED ORDER — FREESTYLE LIBRE 3 PLUS SENSOR MISC: 11 refills | Status: DC

## 2023-12-04 NOTE — Progress Notes (Signed)
 Freestyle libre 3+ ordered per patient request   Lab Results  Component Value Date   HGBA1C 7.7 (H) 06/14/2023

## 2023-12-04 NOTE — Telephone Encounter (Signed)
 Patient would like to have a prescription for the free style libre instead of poking his fingers. Please review and advise

## 2023-12-04 NOTE — Telephone Encounter (Signed)
RX sent to patients pharmacy

## 2023-12-11 ENCOUNTER — Telehealth: Payer: Self-pay

## 2023-12-11 ENCOUNTER — Other Ambulatory Visit (HOSPITAL_COMMUNITY): Payer: Self-pay

## 2023-12-11 NOTE — Telephone Encounter (Signed)
 Pharmacy Patient Advocate Encounter   Received notification from Onbase that prior authorization for FreeStyle Libre 3 Plus Sensor  is required/requested.   Insurance verification completed.   The patient is insured through [employer] .   Per test claim: PA required; PA submitted to above mentioned insurance via Latent Key/confirmation #/EOC [facility] Status is pending

## 2023-12-12 NOTE — Telephone Encounter (Signed)
 Pharmacy Patient Advocate Encounter  Received notification from [employer] that Prior Authorization for FreeStyle Libre 3 Plus Sensor  has been DENIED.  See denial reason below. No denial letter attached in CMM. Will attach denial letter to Media tab once received.   PA #/Case ID/Reference #: [id number]

## 2023-12-14 ENCOUNTER — Other Ambulatory Visit: Payer: Self-pay

## 2023-12-14 NOTE — Patient Outreach (Signed)
 Complex Care Management   Visit Note  12/14/2023  Name:  Notnamed Scholz MRN: [id number] DOB: Mar 10, 1961  Situation: Referral received for Complex Care Management related to SDOH Barriers:  Housing rent Food insecurity Lack of essential utilities lights and water I obtained verbal consent from Patient.  Visit completed with Patient  on the phone  Background:   Past Medical History:  Diagnosis Date   Chronic bilateral low back pain 12/05/2022   Diabetes mellitus without complication (HCC) 11/20/2022   Groin pain, right 10/13/2022   H. 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 Patient states he did receive the resources SW sent. I SW explained the process to getting assistance, as well as making sure patient had SW telephone number for any future needs.    Recommendation:   No recommendations at this time.  Follow Up Plan:   Patient and daughter will contact resources sent for assistance.  [name], [name], [name] [place]  Value Based Care Institute Social Worker, Population Health [phone]

## 2023-12-14 NOTE — Patient Instructions (Signed)
 Visit Information  Vincent [name] was given information about Medicaid Managed Care team care coordination services as a part of their Healthy [facility] Medicaid benefit. Vincent [name]   If you would like to schedule transportation through your Healthy [facility] plan, please call the following number at least 2 days in advance of your appointment: [phone]  For information about your ride after you set it up, call Ride Assist at [phone]. Use this number to activate a Will Call pickup, or if your transportation is late for a scheduled pickup. Use this number, too, if you need to make a change or cancel a previously scheduled reservation.  If you need transportation services right away, call [phone]. The after-hours call center is staffed 24 hours to handle ride assistance and urgent reservation requests (including discharges) 365 days a year. Urgent trips include sick visits, hospital discharge requests and life-sustaining treatment.  Call the [facility] Line at [phone], at any time, 24 hours a day, 7 days a week. If you are in danger or need immediate medical attention call 911.   Vincent [name] - following are the goals we discussed in your visit today:   Goals Addressed             This Visit's Progress    COMPLETED: BSW VBCI Social Work Care Plan       Problems:   Corporate treasurer , Geophysicist/field seismologist , and Housing   CSW Clinical Goal(s):   Over the next 30 days the Patient will work with Child psychotherapist to address concerns related to SDOH.  Interventions:  SW mailed resources for food, rent, and utilities  Patient Goals/Self-Care Activities:  Patient will use resources SW mailed to assist with food,rent and utilities.  Plan:   The patient has been provided with contact information for the care management team and has been advised to call with any health related questions or concerns.         The  Patient                                               has been provided with contact information for the Managed Medicaid care management team and has been advised to call with any health related questions or concerns.   [name], [name], [name]   [facility] Institute Social Worker, Population Health [phone]   Following is a copy of your plan of care:  There are no care plans that you recently modified to display for this patient.

## 2023-12-17 ENCOUNTER — Encounter: Payer: Self-pay | Admitting: Family Medicine

## 2023-12-17 ENCOUNTER — Ambulatory Visit (INDEPENDENT_AMBULATORY_CARE_PROVIDER_SITE_OTHER): Admitting: Family Medicine

## 2023-12-17 VITALS — BP 119/82 | HR 111 | Temp 99.3°F | Ht 67.0 in | Wt 152.8 lb

## 2023-12-17 DIAGNOSIS — M545 Low back pain, unspecified: Secondary | ICD-10-CM | POA: Diagnosis not present

## 2023-12-17 DIAGNOSIS — R1031 Right lower quadrant pain: Secondary | ICD-10-CM

## 2023-12-17 DIAGNOSIS — R109 Unspecified abdominal pain: Secondary | ICD-10-CM

## 2023-12-17 DIAGNOSIS — Z758 Other problems related to medical facilities and other health care: Secondary | ICD-10-CM | POA: Diagnosis not present

## 2023-12-17 DIAGNOSIS — G8929 Other chronic pain: Secondary | ICD-10-CM

## 2023-12-17 DIAGNOSIS — Z7984 Long term (current) use of oral hypoglycemic drugs: Secondary | ICD-10-CM

## 2023-12-17 DIAGNOSIS — E119 Type 2 diabetes mellitus without complications: Secondary | ICD-10-CM | POA: Diagnosis not present

## 2023-12-17 MED ORDER — JANUMET XR 50-1000 MG PO TB24
1.0000 | ORAL_TABLET | Freq: Two times a day (BID) | ORAL | 3 refills | Status: DC
  Filled 2024-02-18: qty 180, 90d supply, fill #0

## 2023-12-17 MED ORDER — TRAMADOL HCL 50 MG PO TABS: 50.0000 mg | ORAL_TABLET | Freq: Three times a day (TID) | ORAL | 0 refills | Status: AC | PRN

## 2023-12-17 MED ORDER — FREESTYLE LIBRE 3 PLUS SENSOR MISC
11 refills | Status: DC
  Filled 2024-02-18: qty 2, 30d supply, fill #0
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
## 2023-12-18 LAB — CMP14+EGFR
ALT: 24 IU/L (ref 0–44)
AST: 19 IU/L (ref 0–40)
Albumin: 4.5 g/dL (ref 3.9–4.9)
Alkaline Phosphatase: 92 IU/L (ref 44–121)
BUN/Creatinine Ratio: 8 — ABNORMAL LOW (ref 10–24)
BUN: 9 mg/dL (ref 8–27)
Bilirubin Total: 0.7 mg/dL (ref 0.0–1.2)
CO2: 24 mmol/L (ref 20–29)
Calcium: 9.2 mg/dL (ref 8.6–10.2)
Chloride: 101 mmol/L (ref 96–106)
Creatinine, Ser: 1.19 mg/dL (ref 0.76–1.27)
Globulin, Total: 3.3 g/dL (ref 1.5–4.5)
Glucose: 204 mg/dL — ABNORMAL HIGH (ref 70–99)
Potassium: 4.3 mmol/L (ref 3.5–5.2)
Sodium: 139 mmol/L (ref 134–144)
Total Protein: 7.8 g/dL (ref 6.0–8.5)
eGFR: 69 mL/min/1.73 (ref >59)

## 2023-12-18 LAB — HEMOGLOBIN A1C
Est. average glucose Bld gHb Est-mCnc: 140 mg/dL
Hgb A1c MFr Bld: 6.5 % — ABNORMAL HIGH (ref 4.8–5.6)

## 2023-12-18 LAB — LIPASE: Lipase: 60 U/L (ref 13–78)

## 2023-12-18 NOTE — Patient Instructions (Signed)
 To keep you healthy, please keep in mind the following health maintenance items that you are due for:   Health Maintenance Due  Topic Date Due   OPHTHALMOLOGY EXAM  Never done     Best Wishes,   Dr. Lang

## 2023-12-19 ENCOUNTER — Ambulatory Visit: Payer: Self-pay | Admitting: Family Medicine

## 2023-12-19 ENCOUNTER — Telehealth: Payer: Self-pay

## 2023-12-19 NOTE — Progress Notes (Signed)
 Called language line interpreter used ID: [id number] [place]. No response when called, message was left to call back regarding results.

## 2023-12-19 NOTE — Progress Notes (Signed)
 Complex Care Management Note  Care Guide Note 12/19/2023 Name: [name] MRN: [id number] DOB: 08-06-1960  Vincent [name] is a 63 y.o. year old male who sees Simmons-Robinson, Rockie, MD for primary care. I reached out to [facility] by phone today to offer complex care management services.  [name] was given information about Complex Care Management services today including:   The Complex Care Management services include support from the care team which includes your Nurse Care Manager, Clinical Social Worker, or Pharmacist.  The Complex Care Management team is here to help remove barriers to the health concerns and goals most important to you. Complex Care Management services are voluntary, and the patient may decline or stop services at any time by request to their care team member.   Complex Care Management Consent Status: Patient agreed to services and verbal consent obtained.   Follow up plan:  Telephone appointment with complex care management team member scheduled for:  01/03/24 at 11:00 a.m.   Encounter Outcome:  Patient Scheduled  Dreama [facility]  [facility], [facility] VBCI Assistant Direct Dial: [phone]  Fax: [phone]

## 2023-12-20 ENCOUNTER — Other Ambulatory Visit (HOSPITAL_COMMUNITY): Payer: Self-pay

## 2023-12-21 ENCOUNTER — Other Ambulatory Visit: Payer: Self-pay | Admitting: Family Medicine

## 2023-12-21 DIAGNOSIS — R1031 Right lower quadrant pain: Secondary | ICD-10-CM

## 2023-12-21 DIAGNOSIS — G8929 Other chronic pain: Secondary | ICD-10-CM

## 2023-12-21 DIAGNOSIS — M545 Low back pain, unspecified: Secondary | ICD-10-CM

## 2023-12-21 NOTE — Progress Notes (Signed)
 Pain management referral submitted.  Please advise patient to use tramadol  50mg  every 8 hours as needed for pain until he is able to see specialist

## 2023-12-21 NOTE — Telephone Encounter (Signed)
 Pain management referral submitted.  Please advise patient to use tramadol  50mg  every 8 hours as needed for pain until he is able to see specialist

## 2023-12-24 ENCOUNTER — Other Ambulatory Visit (HOSPITAL_COMMUNITY): Payer: Self-pay

## 2023-12-25 ENCOUNTER — Other Ambulatory Visit: Payer: Self-pay

## 2023-12-25 NOTE — Patient Instructions (Signed)
 Taarifa ya Ziara Bw. Fleet Bales alipewa taarifa kuhusu huduma za uratibu wa huduma kutoka kwa timu ya [place] [facility] kama sehemu ya faida yake ya [facility] [place].  Abran Hannah ungependa kupanga usafiri kupitia mpango wako wa Healthy [place], tafadhali piga simu kwa nambari ifuatayo angalau siku 2 mordechai patricia reynold garth: [phone]  [facility] taarifa kuhusu usafiri wako baada ya [place], [place] Ride Assist kwa nambari hiyo hiyo. Tumia nambari hiyo bhutan huduma ya [employer], au ikiwa usafiri umechelewa. [facility] unahitaji huduma za [place], [place] [phone] -- kituo cha simu kinafanya kazi saa 24 kwa siku, siku 365 kwa mwaka kwa [place] antilles wa usafiri na maombi ya dharura (ikiwemo kutoka hospitali). Safari za dharura ni pamoja na ziara za [place], kutoka hospitali, na matibabu ya [place]. Msaada wa Afya ya Akili Piga Mstari wa Dharura wa Afya patricia Alline marena adriane [phone] saa yoyote, siku 7 kwa wiki. Hannah uko hatarini au unahitaji msaada wa haraka wa matibabu, piga 911.  Malengo Tuliyojadili Leo Bw. Felecia, haya ndiyo malengo tuliyojadili:  Maendeleo ya Ziara Hii Mpango wa Huduma wa VBCI RN - Kisukari (DM): Inaendelea vizuri Matatizo Mahitaji ya msaada na elimu kuhusu usimamizi wa magonjwa sugu yanayohusiana na Kisukari Aina ya 2 (DMII) Malengo Ndani ya siku 90 zijazo, mgonjwa atahudhuria miadi yote ya matibabu kama ilivyopangwa Ataendelea kufanya kazi na Meneja wa Huduma ya RN na/au Mfanyakazi wa Jamii kushughulikia mahitaji ya uratibu wa huduma Atatumia dawa zote kama zilivyoagizwa na atawasiliana na mtoa huduma kwa maswali kuhusu dawa Ataelewa kwa ujumla mchakato wa ugonjwa wa kisukari na mpango wa eBay kiafya Mikakati ya Kisukari Tathmini ya uelewa wa mgonjwa kuhusu lengo la A1c: chini ya 7% Elimu kuhusu mchakato wa ugonjwa wa kisukari Mapitio ya dawa na umuhimu wa kufuata maagizo Ushauri wa [employer] sukari ya damu  asubuhi na jioni na kurekodi matokeo Rufaa kwa timu ya famasia kwa msaada wa malipo Rufaa kwa timu ya kazi za kijamii kwa mahitaji magumu ya kijamii Mapitio ya hali ya [place], matokeo ya maabara, dawa, na ripoti za wataalamu Uchunguzi wa dalili za msongo wa mawazo unaohusiana na ugonjwa sugu Tathmini ya vikwazo vya kijamii vinavyoathiri afya Taarifa kuhusu lishe ya kisukari ilijadiliwa na kutumwa kwa barua Ufafanuzi: mgonjwa bado anapima sukari ya damu kwa mkono ingawa ameandikiwa CGM Mawasiliano yote yametafsiriwa kwa [place], mkalimani alihusishwa katika simu zote Matokeo ya Maabara HGBA1C: 6.5 (juu kidogo) tarehe 12/17/2023 Shughuli za Kujitunza Hudhuria miadi yote ya matibabu Piga simu kwa famasia siku 3-7 kabla ya dawa teresita Broker na ofisi ya mtoa huduma kwa maswali mapya Tumia dawa kama zilivyoagizwa Fanya kazi na mfanyakazi wa kijamii na timu ya kliniki kushughulikia mahitaji ya afya Pima sukari ya damu asubuhi na jioni Kagua miguu kila siku kwa vidonda au wekundu Andika matokeo ya sukari na dawa kwenye daftari la kila siku Peleka daftari hilo kwenye miadi ya daktari Mpango Miadi ya ufuatiliaji kwa simu na mwanachama wa timu ya usimamizi wa huduma imepangwa kwa: 14/01/2024 saa 7:00 mchana Elimu Kuhusu Kikohozi cha Muda Mrefu Kikohozi ni mchakato wa asili unaosaidia kusafisha koo na njia ya hewa. Kikohozi cha muda mrefu (zaidi ya wiki 8) kinaweza kuashiria ugonjwa unaohitaji matibabu. Aina mbili: Kikohozi cha dalili: husababishwa na ugonjwa unaotambulika Kikohozi kisichotibika: hakiishi hata baada ya vipimo na matibabu Visababishi Magonjwa ya muda mrefu ya mapafu (COPD, pumu, fibrosis ya mapafu) Matatizo ya njia ya juu ya hewa (mzio, sinusitis, reflux ya tumbo) Dawa fulani Uvutaji sigara Maelekezo ya Nyumbani Dawa  Tumia dawa kama ulivyoelekezwa na mtoa huduma [place] kuhusu  chanjo ya mafua au nimonia Kudhibiti koo kavu au chungu  Piga gargle na maji ya chumvi  mara 3-4 kwa siku Tumia pipi ya kikohozi au asali Tumia mashine ya mvuke baridi nyumbani Mtindo wa Maisha  [place] moshi wa sigara Usitumie bidhaa za tumbaku au nikotini [place] vitu vinavyoweza kusababisha mzio au kuwasha koo Maelekezo ya Jumla  Kunywa maji ya kutosha ili mkojo wako uwe na rangi ya njano hafifu Funika mdomo unapokohoa [place] watu wagonjwa Osha mikono mara kwa mara kwa sabuni na maji kwa sekunde 20 [place] na mtoa huduma ikiwa:  Kikohozi kinaongezeka Una homa au baridi [place] kwa shida Pata msaada wa haraka ikiwa:  Unapata shida ya noelle Query maumivu ya kifua Dalili hizi zinaweza kuwa dharura. Piga 911 mara moja. Usingoje kuona kama zitapotea. Usijendeshe mwenyewe kwenda hospitali.  Taarifa hii haikusudiwi reunion nafasi ya ushauri wa mtoa huduma wako wa afya. Hakikisha unajadili maswali yoyote na mtoa huduma wako.  Nestora Duos, MSN, RN [place]  Taasisi ya Huduma ya [place], Afya ya Jamii Meneja wa [place] ya RN Simu ya [place]: [phone]  Faksi: [phone]  Noberto cones [facility] Mrefu [place] ni mchakato wa asili (reflex) unaosaidia kusafisha koo na njia za hewa (mfumo wa kupumua). Husaidia kuponya na kulinda mapafu yako. Ni kawaida kukohoa mara kwa mara. Kikohozi kinachotokea pamoja na dalili nyingine au kinachodumu kwa muda mrefu kinaweza kuwa ishara ya hali inayohitaji matibabu.  Kikohozi cha muda mrefu (chronic) kinaweza kudumu kwa wiki 8 au zaidi. Kuna aina mbili za kikohozi cha muda mrefu:  Kikohozi cha dalili: husababishwa na ugonjwa unaoweza kutambuliwa na kutibiwa. Kikohozi kisichotibika: kikohozi ambacho [phone] hata baada ya vipimo na matibabu. Visababishi vya kikohozi cha muda mrefu Magonjwa ya muda mrefu ya mapafu kama vile: Ugonjwa sugu wa mozambique hewa (COPD) Pumu Fibrosis ya mapafu Matatizo ya njia ya juu ya hewa: Mzio Sinusitis Reflux ya tumbo (asidi kurudi juu) Dawa fulani Uvutaji sigara Maelekezo ya Kufuatwa  Nyumbani Dawa Tumia dawa za dukani na zile zilizoandikwa na daktari kama ulivyoelekezwa. Uliza mtoa huduma wako kuhusu kupata chanjo ya mafua (influenza) au nimonia. Kudhibiti koo kavu au chungu Kama koo lako ni kavu au linauma, piga gargle na mchanganyiko wa chumvi na maji mara 3-4 kwa siku au kadri inavyohitajika. Tengeneza maji ya chumvi kwa kuchanganya kikombe 1 (237 mL) cha maji ya uvuguvugu na -1 kijiko cha chai (3-6 g) ya chumvi hadi iyeyuke [place]. Tumia pipi ya kikohozi au asali kutuliza koo. Tumia mashine ya mvuke baridi nyumbani kuongeza unyevu hewani. Mtindo wa Maisha [place] moshi wa sigara. Usitumie bidhaa zozote zenye nikotini au tumbaku, kama vile sigara, tumbaku ya deirdre, au vifaa vya kuvuta kama e-cigarettes. Ikiwa unahitaji msaada wa [place], muulize mtoa [place]. [place] vitu vinavyoweza kuwasha koo au kusababisha mzio. Maelekezo ya Jumla Kunywa maji ya kutosha ili mkojo wako uwe na rangi ya njano hafifu. Funika mdomo wako kila unapokohoa. [place] watu wagonjwa -- kupata mafua au homa kunaweza kufanya kikohozi kiwe kibaya zaidi. Osha mikono mara kwa mara kwa sabuni na maji kwa angalau sekunde 20. Ikiwa sabuni na maji havipatikani, tumia sanitizer ya mikono. [place] na mtoa huduma wa afya ikiwa: Noberto chadwick Query homa au baridi [place] kwa shida Pata msaada wa [place] ikiwa: Unapata shida ya noelle Query maumivu ya kifua Dalili hizi zinaweza kuwa dharura. Piga 911 mara moja.  Usingoje kuona kama dalili zitapotea Usijendeshe mwenyewe kwenda hospitali Taarifa hii haikusudiwi reunion nafasi ya ushauri wa mtoa huduma wako wa afya. Hakikisha unajadili maswali yoyote na mtoa huduma wako.  Hati Iliyorekebishwa: 04/20/2022 Hati Iliyopitiwa:  12/08/2021 Elimu ya Leonel baptist Elsevier  2024 Elsevier Inc.    Visit Information  Mr. Selsor was given information about Medicaid Managed Care team care coordination services as a part of their Healthy [facility]  Medicaid benefit. Ercel Gedeon   If you would like to schedule transportation through your Healthy [facility] plan, please call the following number at least 2 days in advance of your appointment: [phone]  For information about your ride after you set it up, call Ride Assist at [phone]. Use this number to activate a Will Call pickup, or if your transportation is late for a scheduled pickup. Use this number, too, if you need to make a change or cancel a previously scheduled reservation.  If you need transportation services right away, call [phone]. The after-hours call center is staffed 24 hours to handle ride assistance and urgent reservation requests (including discharges) 365 days a year. Urgent trips include sick visits, hospital discharge requests and life-sustaining treatment.  Call the [facility] Line at [phone], at any time, 24 hours a day, 7 days a week. If you are in danger or need immediate medical attention call 911.   Mr. 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  Goal: Over the next 90 days the Patient will attend all scheduled medical appointments: Patient will attend all appointments as evidenced by chart review        continue to work with RN Care Manager and/or Social Worker to address care management and care coordination needs related to DMII as evidenced by adherence to care management team scheduled appointments     take all medications exactly as prescribed and will call provider for medication related questions as evidenced by reporting compliance with medications and calling pharmacy 7 days prior to running out so no missed medications    verbalize basic understanding of DMII disease process and self health management plan as  evidenced by checking FBG and eve BG and recording daily, take readings to PCP appointment, call provider if having high or low BG or any questions or concerns, follow diabetic diet, exercise  Interventions:   Diabetes Interventions: Assessed patient's understanding of A1c goal: <7% Provided education to patient about basic DM disease process Reviewed medications with patient and discussed importance of medication adherence - patient states could not reach pharmacy, message sent to PCP requesting refills  Advised patient, providing education and rationale, to check cbg fasting in morning and evening and record, calling Primary Care provider for findings outside established parameters Referral made to pharmacy team for assistance with copays Referral made to social work team for assistance with Complex SDOH needs Review of patient status, including review of consultants reports, relevant laboratory and other test results, and medications completed Screening for signs and symptoms of depression related to chronic disease state  Assessed social determinant of health barriers Diabetic diet information discussed and sent via mail  Clarification - patient still checking BG manually though prescribed CGM All correspondence translated into Swahili, interpreter used for all calls Lab Results  Component Value Date   HGBA1C 6.5 (H) 12/17/2023    Patient Self-Care Activities:  Attend all scheduled provider appointments Call pharmacy for medication refills 3-7 days in  advance of running out of medications Call provider office for new concerns or questions  Take medications as prescribed   Work with the social worker to address care coordination needs and will continue to work with the clinical team to address health care and disease management related needs check blood sugar at prescribed times: fasting in morning and evening as per provider check feet daily for cuts, sores or redness enter blood  sugar readings and medication or insulin into daily log take the blood sugar log to all doctor visits  Plan:  Telephone follow up appointment with care management team member scheduled for:  01/22/2024 at 1:00 pm             Please see education materials related to Cough provided as print materials.   The patient verbalized understanding of instructions, educational materials, and care plan provided today and agreed to receive a mailed copy of patient instructions, educational materials, and care plan.   Telephone follow up appointment with Managed Medicaid care management team member scheduled for: 01/22/2024 at 1:00 pm  Nestora Duos, MSN, RN [place]  [facility], [facility] RN Care Manager Direct Dial: [phone] Fax: [phone]  Chronic Cough Coughing is a reflex that clears your throat and airways (respiratory system). It helps heal and protect your lungs. It is normal to cough from time to time. A cough that happens with other symptoms or that lasts a long time may be a sign of a condition that needs treatment. A long-term (chronic) cough may last 8 or more Izzah [place]. There are two types of chronic cough: A symptomatic chronic cough. This is caused by a disease that can be found and treated. A refractory chronic cough. This is a cough that does not go away with testing and treatment. A chronic cough may be caused by: Long-term lung diseases. These include chronic obstructive pulmonary disease (COPD), asthma, and pulmonary fibrosis. Upper airway problems. These include allergies, sinusitis, and gastric reflux. Some medicines. Smoking. Follow these instructions at home: Medicines Take over-the-counter and prescription medicines only as told by your health care provider. Ask your provider about getting a flu (influenza) or pneumonia vaccine. Managing a sore or dry throat If your throat is sore or dry, gargle with a mixture of salt and water 3-4  times a day or as needed. To make salt water, completely dissolve -1 tsp (3-6 g) of salt in 1 cup (237 mL) of warm water. Soothe your throat with a cough drop or honey. A dry throat may make your cough worse. Use a cool mist vaporizer at home to add moisture to the air. Lifestyle Avoid cigarette smoke. Do not use any products that contain nicotine or tobacco. These products include cigarettes, chewing tobacco, and vaping devices, such as e-cigarettes. If you need help quitting, ask your provider. Avoid things that may irritate your throat or trigger your allergies. General instructions  Drink enough fluid to keep your pee (urine) pale yellow. Always cover your mouth when you cough. Stay away from people who are sick. Getting a cold or the flu can make your cough worse. Wash your hands often with soap and water for at least 20 seconds. If soap and water are not available, use hand sanitizer. Contact a health care provider if: Your cough gets worse. You have a fever or chills. You are short of breath. Get help right away if: You have trouble breathing. You have chest pain. These symptoms may be an emergency. Get help right  away. Call 911. Do not wait to see if the symptoms will go away. Do not drive yourself to the hospital. This information is not intended to replace advice given to you by your health care provider. Make sure you discuss any questions you have with your health care provider. Document Revised: 04/20/2022 Document Reviewed: 12/08/2021 Elsevier Patient Education  2024 [employer].  Following is a copy of your plan of care:  There are no care plans that you recently modified to display for this patient.

## 2023-12-25 NOTE — Patient Outreach (Signed)
 Complex Care Management   Visit Note  12/25/2023  Name:  Vincent [name] MRN: [id number] DOB: Dec 20, 1960  Situation: Referral received for Complex Care Management related to Diabetes with Complications I obtained verbal consent from Patient.  Visit completed with Patient & Swahili Interpreter  on the phone  Background:   Past Medical History:  Diagnosis Date   Chronic bilateral low back pain 12/05/2022   Diabetes mellitus without complication (HCC) 11/20/2022   Groin pain, right 10/13/2022   H. 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   07/07/21 Positive T-Spot by [facility]     Right sided abdominal pain 10/13/2022   Right testicular pain 03/15/2023   Swahili language interpreter needed 11/20/2022    Assessment: Patient Reported Symptoms:  Cognitive Cognitive Status: Struggling with memory recall, Alert and oriented to person, place, and time, Insightful and able to interpret abstract concepts, Normal speech and language skills (states forgetful at times) Cognitive/Intellectual Conditions Management [RPT]: None reported or documented in medical history or problem list   Health Maintenance Behaviors: Annual physical exam  Neurological Neurological Review of Symptoms: No symptoms reported Neurological Management Strategies: Routine screening  HEENT HEENT Symptoms Reported: No symptoms reported HEENT Management Strategies: Routine screening    Cardiovascular Cardiovascular Symptoms Reported: Chest pain or discomfort Does patient have uncontrolled Hypertension?: No Cardiovascular Management Strategies: Routine screening Cardiovascular Comment: 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, does not think related to heart- nothing helps states would like to see someone for pain  Respiratory Respiratory Symptoms Reported: Dry cough Other Respiratory Symptoms: dry  cough - worse at night for 2-3 years, reports occasional sweats 1-2x week at night requesting TB recheck Respiratory Management Strategies: Routine screening  Endocrine Endocrine Symptoms Reported: No symptoms reported Is patient diabetic?: Yes Is patient checking blood sugars at home?: Yes List most recent blood sugar readings, include date and time of day: FBG today 135    Gastrointestinal Gastrointestinal Symptoms Reported: Abdominal pain or discomfort Additional Gastrointestinal Details: continues with pain from below navel up into chest Gastrointestinal Management Strategies: Medication therapy    Genitourinary Genitourinary Symptoms Reported: Other Other Genitourinary Symptoms: Right testicular pain for over 1 year - seeing Urology 03/14/2024    Integumentary Integumentary Symptoms Reported: Not assessed    Musculoskeletal Musculoskelatal Symptoms Reviewed: Back pain, Joint pain, Muscle pain Additional Musculoskeletal Details: 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, not related to heart- nothing helps states would like to see someone for pain management - message sent to PCP, no falls Musculoskeletal Management Strategies: Routine screening Falls in the past year?: No Number of falls in past year: 1 or less Was there an injury with Fall?: No Fall Risk Category Calculator: 0 Patient Fall Risk Level: Low Fall Risk Patient at Risk for Falls Due to: Other (Comment) (pain) Fall risk Follow up: Falls evaluation completed, Falls prevention discussed  Psychosocial Psychosocial Symptoms Reported: No symptoms reported          12/25/2023    PHQ2-9 Depression Screening   Little interest or pleasure in doing things Not at all  Feeling down, depressed, or hopeless Not at all  PHQ-2 - Total Score 0  Trouble falling or staying asleep, or sleeping too much    Feeling tired or having little energy    Poor appetite or overeating     Feeling bad about yourself - or that you are a  failure or  have let yourself or your family down    Trouble concentrating on things, such as reading the newspaper or watching television    Moving or speaking so slowly that other people could have noticed.  Or the opposite - being so fidgety or restless that you have been moving around a lot more than usual    Thoughts that you would be better off dead, or hurting yourself in some way    PHQ2-9 Total Score    If you checked off any problems, how difficult have these problems made it for you to do your work, take care of things at home, or get along with other people    Depression Interventions/Treatment      There were no vitals filed for this visit.  Medications Reviewed Today   Medications were not reviewed in this encounter     Recommendation:   PCP Follow-up Specialty provider follow-up Urology 03/14/2024 Continue Current Plan of Care  Follow Up Plan:   Telephone follow-up in 1 month  Nestora Duos, MSN, RN [facility]  [facility], [facility] RN Care Manager Direct Dial: [phone] Fax: [phone]

## 2023-12-27 ENCOUNTER — Other Ambulatory Visit: Payer: Self-pay | Admitting: Family Medicine

## 2023-12-27 DIAGNOSIS — E119 Type 2 diabetes mellitus without complications: Secondary | ICD-10-CM

## 2023-12-27 NOTE — Telephone Encounter (Unsigned)
 Copied from CRM [id number]. Topic: Clinical - Medication Refill >> Dec 27, 2023  9:17 AM Wess RAMAN wrote: Medication: Accu-Chek Softclix Lancets lancets  glucose blood (ACCU-CHEK GUIDE TEST) test strip   Has the patient contacted their pharmacy? No (Agent: If no, request that the patient contact the pharmacy for the refill. If patient does not wish to contact the pharmacy document the reason why and proceed with request.) (Agent: If yes, when and what did the pharmacy advise?)  This is the patient's preferred pharmacy:  [facility] [address], [place] - [address] [place] [address] Phone: [phone] Fax: [phone]  Is this the correct pharmacy for this prescription? Yes If no, delete pharmacy and type the correct one.   Has the prescription been filled recently? Yes  Is the patient out of the medication? Yes  Has the patient been seen for an appointment in the last year OR does the patient have an upcoming appointment? Yes  Can we respond through MyChart? No  Agent: Please be advised that Rx refills may take up to 3 business days. We ask that you follow-up with your pharmacy.

## 2023-12-28 MED ORDER — ACCU-CHEK GUIDE TEST VI STRP
ORAL_STRIP | 12 refills | Status: AC
Start: 2023-12-28 — End: ?
  Filled 2024-02-18: qty 100, 30d supply, fill #0
  Filled 2024-02-21: qty 100, 34d supply, fill #0

## 2023-12-28 NOTE — Telephone Encounter (Signed)
 Requested medication (s) are due for refill today: no  Requested medication (s) are on the active medication list: yes  Last refill:  06/14/23 200 each  Future visit scheduled: yes  Notes to clinic:  strips not attached to a protocol. NT not delegated to refuse. Requesting too soon.   Requested Prescriptions  Pending Prescriptions Disp Refills   glucose blood (ACCU-CHEK GUIDE TEST) test strip 200 each 12    Sig: Use as instructed     There is no refill protocol information for this order

## 2024-01-03 ENCOUNTER — Encounter: Payer: Self-pay | Admitting: Family Medicine

## 2024-01-03 ENCOUNTER — Ambulatory Visit: Admitting: Family Medicine

## 2024-01-03 ENCOUNTER — Other Ambulatory Visit

## 2024-01-03 VITALS — BP 115/83 | HR 73 | Temp 99.0°F | Ht 67.0 in | Wt 154.2 lb

## 2024-01-03 DIAGNOSIS — J3089 Other allergic rhinitis: Secondary | ICD-10-CM | POA: Diagnosis not present

## 2024-01-03 DIAGNOSIS — R0982 Postnasal drip: Secondary | ICD-10-CM

## 2024-01-03 DIAGNOSIS — H6123 Impacted cerumen, bilateral: Secondary | ICD-10-CM

## 2024-01-03 DIAGNOSIS — K30 Functional dyspepsia: Secondary | ICD-10-CM

## 2024-01-03 DIAGNOSIS — R053 Chronic cough: Secondary | ICD-10-CM | POA: Diagnosis not present

## 2024-01-03 DIAGNOSIS — R1011 Right upper quadrant pain: Secondary | ICD-10-CM

## 2024-01-03 DIAGNOSIS — G8929 Other chronic pain: Secondary | ICD-10-CM

## 2024-01-03 DIAGNOSIS — Z758 Other problems related to medical facilities and other health care: Secondary | ICD-10-CM

## 2024-01-03 DIAGNOSIS — J309 Allergic rhinitis, unspecified: Secondary | ICD-10-CM | POA: Insufficient documentation

## 2024-01-03 DIAGNOSIS — M545 Low back pain, unspecified: Secondary | ICD-10-CM

## 2024-01-03 LAB — POC COVID19 BINAXNOW: SARS Coronavirus 2 Ag: NEGATIVE

## 2024-01-03 LAB — POCT INFLUENZA A/B
Influenza A, POC: NEGATIVE
Influenza B, POC: NEGATIVE

## 2024-01-03 MED ORDER — DEBROX 6.5 % OT SOLN: 5.0000 [drp] | Freq: Two times a day (BID) | OTIC | 2 refills | Status: AC

## 2024-01-03 MED ORDER — CETIRIZINE HCL 10 MG PO TABS
10.0000 mg | ORAL_TABLET | Freq: Every day | ORAL | 11 refills | Status: AC
  Filled 2024-02-18: qty 30, 30d supply, fill #0

## 2024-01-03 MED ORDER — FLUTICASONE PROPIONATE 50 MCG/ACT NA SUSP: 2.0000 | Freq: Every day | NASAL | 6 refills | Status: AC

## 2024-01-03 MED ORDER — DULOXETINE HCL 60 MG PO CPEP
60.0000 mg | ORAL_CAPSULE | Freq: Every day | ORAL | 3 refills | Status: AC
  Filled 2024-02-18: qty 60, 60d supply, fill #0

## 2024-01-03 MED ORDER — PANTOPRAZOLE SODIUM 40 MG PO TBEC
40.0000 mg | DELAYED_RELEASE_TABLET | Freq: Every day | ORAL | 3 refills | Status: DC
  Filled 2024-02-18 – 2024-03-11 (×2): qty 30, 30d supply, fill #0

## 2024-01-03 NOTE — Patient Instructions (Addendum)
 Please report to [facility] located at:  [address]  [place], [place] [address]  You do not need an appointment to have xrays completed.   Our office will follow up with  results once available.      To keep you healthy, please keep in mind the following health maintenance items that you are due for:   Health Maintenance Due  Topic Date Due   OPHTHALMOLOGY EXAM  Never done     Best Wishes,   Dr. Lang

## 2024-01-03 NOTE — Progress Notes (Deleted)
 S:     No chief complaint on file.   Reason for visit: ?  Vincent [name] is a 63 y.o. male with a history of diabetes ({Blank single:19197::type 1,type 2,unclear type}), who presents today for {lzvisittype:31254} diabetes pharmacotherapy visit.? Pertinent PMH also includes ***.  Known DM Complications: {DM complications:33329}   Care Team: Primary Care Provider: Sharma Coyer, MD  At last visit, ***.   Patient reports Diabetes was diagnosed in ***.   Current diabetes medications include: Janumet  XR 50/1000 mg BID Previous diabetes medications include: *** Current hypertension medications include: *** Current hyperlipidemia medications include: ***  Patient reports adherence to taking all medications as prescribed.  *** Patient denies adherence with medications, reports missing *** medications *** times per week, on average.  Have you been experiencing any side effects to the medications prescribed? {YES NO:22349} Do you have any problems obtaining medications due to transportation or finances? {YES [id number] Insurance coverage: ***  Patient {Actions; denies-reports:120008} hypoglycemic events.  Reported home fasting blood sugars: ***  Reported 2 hour post-meal/random blood sugars: ***.  Patient {Actions; denies-reports:120008} nocturia (nighttime urination).  Patient {Actions; denies-reports:120008} neuropathy (nerve pain). Patient {Actions; denies-reports:120008} visual changes. Patient {Actions; denies-reports:120008} self foot exams.   Patient reported dietary habits: Eats *** meals/day Breakfast: *** Lunch: *** Dinner: *** Snacks: *** Drinks: ***  Patient-reported exercise habits: *** DM Prevention:  Statin: Not taking.?  History of albuminuria? yes, last UACR on 03/14/24 = 53 mg/g Last eye exam: ***; {lzdmeyeexam:31121}   Tobacco Use:  Tobacco Use: Medium Risk (12/17/2023)   Patient History    Smoking Tobacco Use: Former    Smokeless  Tobacco Use: Never    Passive Exposure: Not on file   O:  Vitals:  Wt Readings from Last 3 Encounters:  12/17/23 152 lb 12.8 oz (69.3 kg)  09/05/23 151 lb 11.2 oz (68.8 kg)  06/27/23 150 lb (68 kg)   BP Readings from Last 3 Encounters:  12/17/23 119/82  09/05/23 117/82  08/03/23 125/77   Pulse Readings from Last 3 Encounters:  12/17/23 (!) 111  09/05/23 80  08/03/23 65     Labs:?  Lab Results  Component Value Date   HGBA1C 6.5 (H) 12/17/2023   HGBA1C 7.7 (H) 06/14/2023   HGBA1C 7.6 (H) 01/16/2023   GLUCOSE 204 (H) 12/17/2023   MICRALBCREAT 53 (H) 03/15/2023   CREATININE 1.19 12/17/2023   CREATININE 1.13 08/03/2023   CREATININE 1.14 01/16/2023    Lab Results  Component Value Date   CHOL 161 06/14/2023   LDLCALC 95 06/14/2023   HDL 34 (L) 06/14/2023   TRIG 187 (H) 06/14/2023   ALT 24 12/17/2023   ALT 16 08/03/2023   AST 19 12/17/2023   AST 19 08/03/2023      Chemistry      Component Value Date/Time   NA 139 12/17/2023 1636   K 4.3 12/17/2023 1636   CL 101 12/17/2023 1636   CO2 24 12/17/2023 1636   BUN 9 12/17/2023 1636   CREATININE 1.19 12/17/2023 1636      Component Value Date/Time   CALCIUM 9.2 12/17/2023 1636   ALKPHOS 92 12/17/2023 1636   AST 19 12/17/2023 1636   ALT 24 12/17/2023 1636   BILITOT 0.7 12/17/2023 1636       The 10-year ASCVD risk score (Arnett DK, et al., 2019) is: 15.1%  Lab Results  Component Value Date   MICRALBCREAT 53 (H) 03/15/2023    A/P: Diabetes currently controlled with a most recent A1c  of 6.5% on 12/17/23, which is down from 7.7% on 06/14/23. Patient is *** able to verbalize appropriate hypoglycemia management plan. Medication adherence appears ***. Control is suboptimal due to ***. -{Meds adjust:18428} basal insulin {basal insulins:33573}  *** units daily.  -{Meds adjust:18428} rapid insulin {bolus insulin:33574} ***.  -{Meds adjust:18428} GLP-1 {GLP1 options:33572} *** mg .  -{Meds adjust:18428} SGLT2-I {SGLT2i  options:33571}*** mg daily.  -{Meds adjust:18428} metformin  ***.  -Patient educated on purpose, proper use, and potential adverse effects of ***.  -Extensively discussed pathophysiology of diabetes, recommended lifestyle interventions, dietary effects on blood sugar control.  -Counseled on s/sx of and management of hypoglycemia.  -Next A1c anticipated ***.   ASCVD risk - {primary/secondary:33575} prevention in patient with diabetes. Last LDL is *** mg/dL, not at goal of <29 *** mg/dL. ASCVD risk factors include *** and 10-year ASCVD risk score of ***. {Desc; low/moderate/high:110033} intensity statin indicated.  -{Meds adjust:18428} {statin therapies:33576} *** mg daily.   Hypertension longstanding *** currently ***. Blood pressure goal of <130/80 *** mmHg. Medication adherence ***. Blood pressure control is suboptimal due to ***. -{Meds adjust:18428} *** mg ***.  {pharmacisttime:33368}  Follow-up:  Pharmacist on *** PCP clinic visit on ***  Peyton CHARLENA Ferries, PharmD Clinical Pharmacist [facility] [phone]

## 2024-01-03 NOTE — Progress Notes (Signed)
 "     ACUTE VISIT   Patient: Vincent [name]   DOB: 08-Apr-1961   63 y.o. Male  MRN: [id number]   PCP: Sharma Coyer, MD  Chief Complaint  Patient presents with   Cough    Patient is present with cough , states this has been going on for a year or more but started to get worse within the last week with an itch in the throat, some sneezing   Subjective    Cough   HPI     Cough    Additional comments: Patient is present with cough , states this has been going on for a year or more but started to get worse within the last week with an itch in the throat, some sneezing      Last edited by Cherry Chiquita HERO, CMA on 01/03/2024  1:06 PM.       Discussed the use of AI scribe software for clinical note transcription with the patient, who gave verbal consent to proceed.  History of Present Illness Vincent [name] is a 63 year old Swahili speaking male who presents with a worsening cough.  Due to language barrier, an interpreter was present during the history-taking and subsequent discussion (and for part of the physical exam) with this patient.   He has experienced a cough for about a year, which has recently worsened. The cough is associated with an itchy throat and a burning sensation in his nostrils, but there is no sputum production. He sometimes has a runny nose when sneezing and experiences body stiffness, which he alleviates by stretching.  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 He did not bring the certificate of treatment completion to the visit.  He experiences a sensation of tiredness in his body and occasionally feels the need to take deep breaths to resume normal breathing. No fevers or chills are reported.  He reports abdominal pain that radiates to his back, described as very painful and uncomfortable, affecting his breathing. He has previously been treated for H. pylori infection, but  subsequent tests have not identified any issues. He experiences discomfort after eating, which sometimes requires him to sit sideways for relief.  He has also been on a proton pump inhibitor in the past for digestive issues.     Medications: Outpatient Medications Prior to Visit  Medication Sig   Accu-Chek Softclix Lancets lancets Use as instructed   Blood Glucose Monitoring Suppl (ACCU-CHEK GUIDE) w/Device KIT See admin instructions.   Blood Glucose Monitoring Suppl DEVI 1 each by Does not apply route in the morning, at noon, and at bedtime. May substitute to any manufacturer covered by patient's insurance.   Continuous Glucose Sensor (FREESTYLE LIBRE 3 PLUS SENSOR) MISC Change sensor every 15 days.   glucose blood (ACCU-CHEK GUIDE TEST) test strip Use as instructed   sildenafil  (VIAGRA ) 100 MG tablet Take 1 tablet (100 mg total) by mouth daily as needed for erectile dysfunction.   SitaGLIPtin -MetFORMIN  HCl (JANUMET  XR) 50-1000 MG TB24 Take 1 tablet by mouth in the morning and at bedtime.   tadalafil  (CIALIS ) 20 MG tablet Take one tablet 30 minutes prior to intercourse   No facility-administered medications prior to visit.    Last CBC Lab Results  Component Value Date   WBC 4.7 08/03/2023   HGB 14.5 08/03/2023   HCT 44.3 08/03/2023   MCV 89.7 08/03/2023   MCH 29.4 08/03/2023   RDW 11.9 08/03/2023  PLT 204 08/03/2023   Last metabolic panel Lab Results  Component Value Date   GLUCOSE 204 (H) 12/17/2023   NA 139 12/17/2023   K 4.3 12/17/2023   CL 101 12/17/2023   CO2 24 12/17/2023   BUN 9 12/17/2023   CREATININE 1.19 12/17/2023   EGFR 69 12/17/2023   CALCIUM  9.2 12/17/2023   PROT 7.8 12/17/2023   ALBUMIN 4.5 12/17/2023   LABGLOB 3.3 12/17/2023   BILITOT 0.7 12/17/2023   ALKPHOS 92 12/17/2023   AST 19 12/17/2023   ALT 24 12/17/2023   ANIONGAP 6 08/03/2023   Last lipids Lab Results  Component Value Date   CHOL 161 06/14/2023   HDL 34 (L) 06/14/2023   LDLCALC 95  06/14/2023   TRIG 187 (H) 06/14/2023   CHOLHDL 4.7 06/14/2023   Last hemoglobin A1c Lab Results  Component Value Date   HGBA1C 6.5 (H) 12/17/2023   Last thyroid functions Lab Results  Component Value Date   TSH 3.160 10/16/2022   Last vitamin D No results found for: 25OHVITD2, 25OHVITD3, VD25OH Last vitamin B12 and Folate No results found for: VITAMINB12, FOLATE    Objective    BP 115/83 (BP Location: Left Arm, Patient Position: Sitting, Cuff Size: Normal)   Pulse 73   Temp 99 F (37.2 C) (Oral)   Ht 5' 7 (1.702 m)   Wt 154 lb 3.4 oz (69.9 kg)   SpO2 100%   BMI 24.15 kg/m  BP Readings from Last 3 Encounters:  01/03/24 115/83  12/17/23 119/82  09/05/23 117/82   Wt Readings from Last 3 Encounters:  01/03/24 154 lb 3.4 oz (69.9 kg)  12/17/23 152 lb 12.8 oz (69.3 kg)  09/05/23 151 lb 11.2 oz (68.8 kg)   SpO2 Readings from Last 3 Encounters:  01/03/24 100%  08/03/23 96%  06/27/23 100%      Physical Exam Constitutional:      Comments: Wearing facial mask   HENT:     Right Ear: There is impacted cerumen.     Left Ear: There is impacted cerumen.     Mouth/Throat:     Mouth: Mucous membranes are moist.     Pharynx: No oropharyngeal exudate.  Cardiovascular:     Rate and Rhythm: Normal rate and regular rhythm.  Pulmonary:     Effort: Pulmonary effort is normal. No respiratory distress.     Breath sounds: Normal breath sounds. No wheezing, rhonchi or rales.  Abdominal:     General: Bowel sounds are normal.     Tenderness: There is abdominal tenderness.  Neurological:     Mental Status: He is alert.      Physical Exam    Results for orders placed or performed in visit on 01/03/24  POC COVID-19 BinaxNow  Result Value Ref Range   SARS Coronavirus 2 Ag Negative Negative  POCT Influenza A/B  Result Value Ref Range   Influenza A, POC Negative Negative   Influenza B, POC Negative Negative    Assessment & Plan     Assessment and  Plan Assessment & Plan Chronic cough and allergic rhinitis Chronic cough for one year, worsening recently. Positive TB test treated with six months of medication. Symptoms include itchy throat and burning sensation in nostrils. Differential includes allergic rhinitis. Lung exam normal with no wheezing or crackles. - Prescribe Zyrtec  10 mg daily - Prescribe Flonase  nasal spray - Order nasal swab for COVID and flu - Order chest x-ray to rule out pneumonia or other abnormalities  Chronic abdominal and chest wall pain Chronic abdominal and chest wall pain with radiation to the back, exacerbated by eating, possibly related to previous H. pylori infection. Differential includes hernia or chest/abdominal wall issues. Liver function normal. - Refer to chronic pain management clinic - Prescribe Protonix  40 mg to take 30 minutes before meals - Prescribe duloxetine  60 mg daily for nerve and back pain  Impacted cerumen, bilateral Excessive ear wax causing itchiness. Ears impacted with wax on examination. - Recommend over-the-counter Debrox drops to soften ear wax     No follow-ups on file.        Rockie Agent, MD  [facility] [phone] (phone) [phone] (fax)  [facility] "

## 2024-01-04 ENCOUNTER — Ambulatory Visit: Payer: Self-pay | Admitting: Family Medicine

## 2024-01-04 ENCOUNTER — Telehealth: Payer: Self-pay

## 2024-01-04 NOTE — Progress Notes (Signed)
 Care Guide Pharmacy Note  01/04/2024 Name: Vincent [name] MRN: [id number] DOB: 1960-07-21  Referred By: Sharma Coyer, MD Reason for referral: Complex Care Management and Call Attempt #1 (Successful initial outreach scheduled with PHARM D- Allyson)   Vincent [name] is a 63 y.o. year old male who is a primary care patient of Simmons-Robinson, Coyer, MD.  Vincent [name] was referred to the pharmacist for assistance related to: DMII  Successful contact was made with the patient to discuss pharmacy services including being ready for the pharmacist to call at least 5 minutes before the scheduled appointment time and to have medication bottles and any blood pressure readings ready for review. The patient agreed to meet with the pharmacist via In Office on (date/time). 01/22/24 @ 1 PM   Vincent [name] [facility], [facility] Guide  Direct Dial: [phone]  Fax [phone]

## 2024-01-09 NOTE — Progress Notes (Unsigned)
 S:     No chief complaint on file.   Reason for visit: ?  Vincent [name] is a 63 y.o. male with a history of diabetes ({Blank single:19197::type 1,type 2,unclear type}), who presents today for {lzvisittype:31254} diabetes pharmacotherapy visit.? Pertinent PMH also includes ***.  Known DM Complications: {DM complications:33329}   Care Team: Primary Care Provider: Sharma Coyer, MD  At last visit, ***.   Patient reports Diabetes was diagnosed in ***.   Current diabetes medications include: Janumet  XR 50/1000 mg BID Previous diabetes medications include: *** Current hypertension medications include: *** Current hyperlipidemia medications include: ***  Patient reports adherence to taking all medications as prescribed.  *** Patient denies adherence with medications, reports missing *** medications *** times per week, on average.  Have you been experiencing any side effects to the medications prescribed? {YES NO:22349} Do you have any problems obtaining medications due to transportation or finances? {YES [id number] Insurance coverage: ***  Patient {Actions; denies-reports:120008} hypoglycemic events.  Reported home fasting blood sugars: ***  Reported 2 hour post-meal/random blood sugars: ***.  Patient {Actions; denies-reports:120008} nocturia (nighttime urination).  Patient {Actions; denies-reports:120008} neuropathy (nerve pain). Patient {Actions; denies-reports:120008} visual changes. Patient {Actions; denies-reports:120008} self foot exams.   Patient reported dietary habits: Eats *** meals/day Breakfast: *** Lunch: *** Dinner: *** Snacks: *** Drinks: ***  Patient-reported exercise habits: *** DM Prevention:  Statin: Not taking.?  History of albuminuria? yes, last UACR on 03/14/24 = 53 mg/g Last eye exam: ***; {lzdmeyeexam:31121}   Tobacco Use:  Tobacco Use: Medium Risk (01/03/2024)   Patient History    Smoking Tobacco Use: Former     Smokeless Tobacco Use: Never    Passive Exposure: Not on file   O:  Vitals:  Wt Readings from Last 3 Encounters:  01/03/24 154 lb 3.4 oz (69.9 kg)  12/17/23 152 lb 12.8 oz (69.3 kg)  09/05/23 151 lb 11.2 oz (68.8 kg)   BP Readings from Last 3 Encounters:  01/03/24 115/83  12/17/23 119/82  09/05/23 117/82   Pulse Readings from Last 3 Encounters:  01/03/24 73  12/17/23 (!) 111  09/05/23 80     Labs:?  Lab Results  Component Value Date   HGBA1C 6.5 (H) 12/17/2023   HGBA1C 7.7 (H) 06/14/2023   HGBA1C 7.6 (H) 01/16/2023   GLUCOSE 204 (H) 12/17/2023   MICRALBCREAT 53 (H) 03/15/2023   CREATININE 1.19 12/17/2023   CREATININE 1.13 08/03/2023   CREATININE 1.14 01/16/2023    Lab Results  Component Value Date   CHOL 161 06/14/2023   LDLCALC 95 06/14/2023   HDL 34 (L) 06/14/2023   TRIG 187 (H) 06/14/2023   ALT 24 12/17/2023   ALT 16 08/03/2023   AST 19 12/17/2023   AST 19 08/03/2023      Chemistry      Component Value Date/Time   NA 139 12/17/2023 1636   K 4.3 12/17/2023 1636   CL 101 12/17/2023 1636   CO2 24 12/17/2023 1636   BUN 9 12/17/2023 1636   CREATININE 1.19 12/17/2023 1636      Component Value Date/Time   CALCIUM 9.2 12/17/2023 1636   ALKPHOS 92 12/17/2023 1636   AST 19 12/17/2023 1636   ALT 24 12/17/2023 1636   BILITOT 0.7 12/17/2023 1636       The 10-year ASCVD risk score (Arnett DK, et al., 2019) is: 14.3%  Lab Results  Component Value Date   MICRALBCREAT 53 (H) 03/15/2023    A/P: Diabetes currently controlled with a most  recent A1c of 6.5% on 12/17/23, which is down from 7.7% on 06/14/23. Patient is *** able to verbalize appropriate hypoglycemia management plan. Medication adherence appears ***. Control is suboptimal due to ***. -{Meds adjust:18428} basal insulin {basal insulins:33573}  *** units daily.  -{Meds adjust:18428} rapid insulin {bolus insulin:33574} ***.  -{Meds adjust:18428} GLP-1 {GLP1 options:33572} *** mg .  -{Meds adjust:18428}  SGLT2-I {SGLT2i options:33571}*** mg daily.  -{Meds adjust:18428} metformin  ***.  -Patient educated on purpose, proper use, and potential adverse effects of ***.  -Extensively discussed pathophysiology of diabetes, recommended lifestyle interventions, dietary effects on blood sugar control.  -Counseled on s/sx of and management of hypoglycemia.  -Next A1c anticipated ***.   ASCVD risk - {primary/secondary:33575} prevention in patient with diabetes. Last LDL is *** mg/dL, not at goal of <29 *** mg/dL. ASCVD risk factors include *** and 10-year ASCVD risk score of ***. {Desc; low/moderate/high:110033} intensity statin indicated.  -{Meds adjust:18428} {statin therapies:33576} *** mg daily.   Hypertension longstanding *** currently ***. Blood pressure goal of <130/80 *** mmHg. Medication adherence ***. Blood pressure control is suboptimal due to ***. -{Meds adjust:18428} *** mg ***.  {pharmacisttime:33368}  Follow-up:  Pharmacist on *** PCP clinic visit on ***  Peyton CHARLENA Ferries, PharmD Clinical Pharmacist [facility] [phone]

## 2024-01-10 ENCOUNTER — Ambulatory Visit (INDEPENDENT_AMBULATORY_CARE_PROVIDER_SITE_OTHER)

## 2024-01-10 DIAGNOSIS — Z7984 Long term (current) use of oral hypoglycemic drugs: Secondary | ICD-10-CM | POA: Diagnosis not present

## 2024-01-22 ENCOUNTER — Other Ambulatory Visit: Payer: Self-pay

## 2024-01-22 NOTE — Patient Outreach (Signed)
 Complex Care Management   Visit Note  01/22/2024  Name:  Vincent [name] MRN: [id number] DOB: 1961/02/04  Situation: Referral received for Complex Care Management related to Diabetes with Complications I obtained verbal consent from Patient.  Visit completed with Patient  on the phone  Background:   Past Medical History:  Diagnosis Date   Chronic bilateral low back pain 12/05/2022   Diabetes mellitus without complication (HCC) 11/20/2022   Groin pain, right 10/13/2022   H. 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   07/07/21 Positive T-Spot by [facility]     Right sided abdominal pain 10/13/2022   Right testicular pain 03/15/2023   Swahili language interpreter needed 11/20/2022    Assessment: Patient Reported Symptoms:  Cognitive Cognitive Status:  (no changes) Cognitive/Intellectual Conditions Management [RPT]: None reported or documented in medical history or problem list   Health Maintenance Behaviors: Annual physical exam  Neurological Neurological Review of Symptoms: Not assessed    HEENT HEENT Symptoms Reported: Not assessed      Cardiovascular Cardiovascular Symptoms Reported: Chest pain or discomfort Cardiovascular Comment: referred to Pain Management  Respiratory Respiratory Symptoms Reported: Dry cough Additional Respiratory Details: saw PCP, order for CXR - aware need to complete Respiratory Management Strategies: Routine screening  Endocrine Is patient diabetic?: Yes Is patient checking blood sugars at home?: Yes List most recent blood sugar readings, include date and time of day: FBG today 153 - ranging 134 to 280 denies change in diet, diabetic diet reviewed    Gastrointestinal Additional Gastrointestinal Details: patient reports saw GI and they said nothing is wrong - frustrated, given information for [facility]agment referral to [facility]- address and phone number given and conferece call with RNCM, interpreter and patient to schedule appointment - on hold for appontment at patient request - referred to practice manager Otha Dover and left VM requesting call back to [facility], discussed ride service with [employer] - patient declined, states used x1 and ride was no show. States will use neighbor but limited between 1100-1300. WIll notify patient when appointment scheduled Gastrointestinal Management Strategies: Medication therapy    Genitourinary Other Genitourinary Symptoms: referred to Pain Management by PCP    Integumentary Integumentary Symptoms Reported: Not assessed Skin Management Strategies: Routine screening  Musculoskeletal Additional Musculoskeletal Details: No falls - referred to Pain management by PCP Musculoskeletal Management Strategies: Routine screening Falls in the past year?: No Number of falls in past year: 1 or less Was there an injury with Fall?: No Fall Risk Category Calculator: 0 Patient Fall Risk Level: Low Fall Risk Patient at Risk for Falls Due to: Other (Comment) (Pain) Fall risk Follow up: Falls evaluation completed, Falls prevention discussed  Psychosocial Psychosocial Symptoms Reported: No symptoms reported          01/22/2024    PHQ2-9 Depression Screening   Little interest or pleasure in doing things    Feeling down, depressed, or hopeless    PHQ-2 - Total Score    Trouble falling or staying asleep, or sleeping too much    Feeling tired or having little energy    Poor appetite or overeating     Feeling bad about yourself - or that you are a failure or have let yourself or your family down    Trouble concentrating on things, such as reading the newspaper or watching television    Moving or speaking so slowly  that other people could have noticed.  Or the opposite - being so fidgety or restless that you have been moving around a lot more than usual    Thoughts that you would  be better off dead, or hurting yourself in some way    PHQ2-9 Total Score    If you checked off any problems, how difficult have these problems made it for you to do your work, take care of things at home, or get along with other people    Depression Interventions/Treatment      There were no vitals filed for this visit.  Medications Reviewed Today   Medications were not reviewed in this encounter     Recommendation:   PCP Follow-up Continue Current Plan of Care  Follow Up Plan:   Telephone follow-up in 1 month  Nestora Duos, MSN, RN [facility]  [facility], [facility] RN Care Manager Direct Dial: [phone] Fax: [phone]

## 2024-01-22 NOTE — Patient Instructions (Signed)
 Taarifa za Ziara Bwana Vanderweide alipatiwa taarifa kuhusu huduma za uratibu wa huduma kutoka kwa timu ya [place] [employer] kama sehemu ya faida zao za Healthy [place]. Pharaoh Stenzel Ikiwa ungependa kupanga usafiri kupitia mpango wako wa Healthy Blue Medicaid, tafadhali piga nambari ifuatayo angalau siku 2 mordechai patricia reynold garth: [phone] [facility] taarifa kuhusu safari yako baada patricia [place], piga Ride Assist kwa [phone]. Tumia nambari hii bhutan huduma ya kuchukuliwa kwa simu (Will Call), au ikiwa usafiri wako umechelewa kwa muda uliopangwa. Tumia nambari hii pia ikiwa unahitaji kufanya mabadiliko au kughairi nafasi uliyopanga awali. [facility] unahitaji huduma [address] [place], [place] [phone]. Kituo cha simu cha baada ya saa za kazi kinafanya [place] saa 24 kwa siku kushughulikia msaada wa usafiri na maombi ya haraka ya nafasi (ikiwemo kutoka hospitalini) siku 365 kwa mwaka. Safari za dharura ni pamoja na ziara za Tancred, kutoka hospitalini, na matibabu ya kuokoa maisha. Piga Mstari wa Dharura wa Afya ya Alena daring [phone], wakati wowote, saa 24 kwa siku, siku 7 kwa wiki. Hannah uko hatarini au unahitaji msaada wa haraka wa matibabu, piga 911. Tafadhali angalia vifaa vya elimu vinavyohusiana na NONE vilivyotolewa kwa njia ya kuchapishwa. Mgonjwa alieleza kwa maneno kuelewa maagizo, vifaa vya elimu, na mpango wa huduma uliotolewa leo na alikubali kupokea nakala ya maagizo ya [place], vifaa vya elimu, na mpango wa huduma kwa njia ya barua. Miadi ya ufuatiliaji kwa njia ya simu na mshiriki wa timu ya usimamizi wa Medicaid Managed Care imepangwa kwa: 02/19/2024 saa 8:00 mchana Nestora Duos, MSN, RN [place]  Taasisi ya Huduma [place], Afya ya [place] ya Watu Msimamizi wa Huduma za Uuguzi [facility] Manager) Simu patricia [place] daring [place]: [phone]  [facility]: [phone]     Visit Information  Mr. Ferrufino was given information about Medicaid Managed Care team care  coordination services as a part of their Healthy [facility] Medicaid benefit. Horrace Denes   If you would like to schedule transportation through your Healthy [facility] plan, please call the following number at least 2 days in advance of your appointment: [phone]  For information about your ride after you set it up, call Ride Assist at [phone]. Use this number to activate a Will Call pickup, or if your transportation is late for a scheduled pickup. Use this number, too, if you need to make a change or cancel a previously scheduled reservation.  If you need transportation services right away, call [phone]. The after-hours call center is staffed 24 hours to handle ride assistance and urgent reservation requests (including discharges) 365 days a year. Urgent trips include sick visits, hospital discharge requests and life-sustaining treatment.  Call the [facility] Line at [phone], at any time, 24 hours a day, 7 days a week. If you are in danger or need immediate medical attention call 911.   Please see education materials related to NONE provided as print materials.   The patient verbalized understanding of instructions, educational materials, and care plan provided today and agreed to receive a mailed copy of patient instructions, educational materials, and care plan.   Telephone follow up appointment with Managed Medicaid care management team member scheduled for:  02/19/2024 at 2:00 pm  Nestora Duos, MSN, RN [facility]  [facility], [facility] RN Care Manager Direct Dial: [phone] Fax: [phone]    Following is a copy of your plan of care:  There are no care plans that you recently modified to display for this patient.

## 2024-01-23 ENCOUNTER — Telehealth: Payer: Self-pay | Admitting: Urology

## 2024-01-23 ENCOUNTER — Other Ambulatory Visit: Payer: Self-pay

## 2024-01-23 MED ORDER — SILDENAFIL CITRATE 100 MG PO TABS
100.0000 mg | ORAL_TABLET | Freq: Every day | ORAL | 3 refills | Status: DC | PRN
  Filled 2024-02-18: qty 10, 10d supply, fill #0

## 2024-01-23 NOTE — Patient Outreach (Signed)
 RNCM spoke with referral coordinator Channelle at [facility]. Confirmed language line available to accommodate patient during appointment. Scheduled or 02/05/2024 at 1130. Called patient using Language line, notified of appointment time within window requested yesterday and given address and phone number again. No questions or further needs at this time. PCP notified.

## 2024-01-23 NOTE — Telephone Encounter (Signed)
 Patient called and needs his Sildenafil  100 mg 60 tablets called into [facility] on [address]. He is completely out and need them refilled soon.

## 2024-01-28 ENCOUNTER — Other Ambulatory Visit: Payer: Self-pay | Admitting: Family Medicine

## 2024-01-28 DIAGNOSIS — E119 Type 2 diabetes mellitus without complications: Secondary | ICD-10-CM

## 2024-01-28 NOTE — Telephone Encounter (Signed)
 Copied from CRM #[id number]. Topic: Clinical - Medication Refill >> Jan 28, 2024  9:06 AM Aleatha C wrote: Medication: glucose blood (ACCU-CHEK GUIDE TEST) test strip  Has the patient contacted their pharmacy? Yes (Agent: If no, request that the patient contact the pharmacy for the refill. If patient does not wish to contact the pharmacy document the reason why and proceed with request.) (Agent: If yes, when and what did the pharmacy advise?)  This is the patient's preferred pharmacy:  [facility] [address] [place] - [address] [place] [address] Phone: [phone] Fax: [phone]  Is this the correct pharmacy for this prescription? Yes If no, delete pharmacy and type the correct one.   Has the prescription been filled recently? No  Is the patient out of the medication? Yes  Has the patient been seen for an appointment in the last year OR does the patient have an upcoming appointment? Yes  Can we respond through MyChart? No  Agent: Please be advised that Rx refills may take up to 3 business days. We ask that you follow-up with your pharmacy.

## 2024-01-29 ENCOUNTER — Encounter: Payer: Self-pay | Admitting: Family Medicine

## 2024-01-29 ENCOUNTER — Ambulatory Visit: Admitting: Family Medicine

## 2024-01-29 VITALS — BP 123/90 | HR 71 | Ht 67.0 in | Wt 152.4 lb

## 2024-01-29 DIAGNOSIS — Z7984 Long term (current) use of oral hypoglycemic drugs: Secondary | ICD-10-CM

## 2024-01-29 DIAGNOSIS — Z758 Other problems related to medical facilities and other health care: Secondary | ICD-10-CM

## 2024-01-29 DIAGNOSIS — Z59869 Financial insecurity, unspecified: Secondary | ICD-10-CM

## 2024-01-29 DIAGNOSIS — R0782 Intercostal pain: Secondary | ICD-10-CM

## 2024-01-29 DIAGNOSIS — G8929 Other chronic pain: Secondary | ICD-10-CM

## 2024-01-29 DIAGNOSIS — R030 Elevated blood-pressure reading, without diagnosis of hypertension: Secondary | ICD-10-CM

## 2024-01-29 DIAGNOSIS — E119 Type 2 diabetes mellitus without complications: Secondary | ICD-10-CM | POA: Diagnosis not present

## 2024-01-29 DIAGNOSIS — M940 Chondrocostal junction syndrome [Tietze]: Secondary | ICD-10-CM

## 2024-01-29 DIAGNOSIS — M545 Low back pain, unspecified: Secondary | ICD-10-CM

## 2024-01-29 DIAGNOSIS — Z23 Encounter for immunization: Secondary | ICD-10-CM

## 2024-01-29 MED ORDER — DICLOFENAC SODIUM 75 MG PO TBEC: 75.0000 mg | DELAYED_RELEASE_TABLET | Freq: Two times a day (BID) | ORAL | 0 refills | Status: AC

## 2024-01-29 MED ORDER — PREGABALIN 75 MG PO CAPS: 75.0000 mg | ORAL_CAPSULE | Freq: Two times a day (BID) | ORAL | 0 refills | Status: AC

## 2024-01-29 NOTE — Progress Notes (Signed)
 Established patient visit   Patient: Vincent [name]   DOB: 09/13/1960   63 y.o. Male  MRN: [id number] Visit Date: 01/29/2024  Today's healthcare provider: Rockie Agent, MD   Chief Complaint  Patient presents with   Medical Management of Chronic Issues    Present for one month f/u right sided pain and hep b/ flu vaccines.  Pain is still there, worsening. Has appt with pain mgmt in November- will reach out to find out date and time    Subjective     HPI     Medical Management of Chronic Issues    Additional comments: Present for one month f/u right sided pain and hep b/ flu vaccines.  Pain is still there, worsening. Has appt with pain mgmt in November- will reach out to find out date and time       Last edited by Cherry Chiquita HERO, CMA on 01/29/2024  1:07 PM.       Discussed the use of AI scribe software for clinical note transcription with the patient, who gave verbal consent to proceed.  History of Present Illness Vincent [name] is a 63 year old male who presents with chronic right-sided pain.  He experiences persistent and worsening chronic right-sided pain, which has increased in intensity from a 7 to an 8 on the pain scale. The pain now radiates to his back, affecting the muscles there. He has tried various medications including gabapentin  and Cymbalta  without relief, and was recently started on tramadol , which also did not alleviate the pain.  He has a history of diverticulosis, with a virtual colonoscopy in July revealing a tortuous colon and diverticulosis on the right side. He has not experienced relief from his abdominal pain.  He reports a persistent dry cough that has not resolved. He was unaware of a chest x-ray ordered in September to investigate this symptom.  His blood pressure was noted to be elevated at 123/90 during the visit. His A1c is well controlled at 6.5.  He is currently taking duloxetine  60 mg and Protonix  40 mg.     Past  Medical History:  Diagnosis Date   Chronic bilateral low back pain 12/05/2022   Diabetes mellitus without complication (HCC) 11/20/2022   Groin pain, right 10/13/2022   H. 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   Blood Glucose Monitoring Suppl DEVI 1 each by Does not apply route in the morning, at noon, and at bedtime. May substitute to any manufacturer covered by patient's insurance.   carbamide peroxide (DEBROX) 6.5 % OTIC solution Place 5 drops into both ears 2 (two) times daily.   cetirizine  (ZYRTEC ) 10 MG tablet Take 1 tablet (10 mg total) by mouth daily.   Continuous Glucose Sensor (FREESTYLE LIBRE 3 PLUS SENSOR) MISC Change sensor every 15 days.   DULoxetine  (CYMBALTA ) 60 MG capsule Take 1 capsule (60 mg total) by mouth daily.   fluticasone  (FLONASE ) 50 MCG/ACT nasal spray Place 2 sprays into both nostrils daily.   glucose blood (ACCU-CHEK GUIDE  TEST) test strip Use as instructed   pantoprazole  (PROTONIX ) 40 MG tablet Take 1 tablet (40 mg total) by mouth daily.   sildenafil  (VIAGRA ) 100 MG tablet Take 1 tablet (100 mg total) by mouth daily as needed for erectile dysfunction.   SitaGLIPtin-MetFORMIN  HCl (JANUMET  XR) 50-1000 MG TB24 Take 1 tablet by mouth in the morning and at bedtime.   tadalafil  (CIALIS ) 20 MG tablet Take one tablet 30 minutes prior to intercourse   No facility-administered medications prior to visit.    Review of Systems  Last CBC Lab Results  Component Value  Date   WBC 4.7 08/03/2023   HGB 14.5 08/03/2023   HCT 44.3 08/03/2023   MCV 89.7 08/03/2023   MCH 29.4 08/03/2023   RDW 11.9 08/03/2023   PLT 204 08/03/2023   Last metabolic panel Lab Results  Component Value Date   GLUCOSE 204 (H) 12/17/2023   NA 139 12/17/2023   K 4.3 12/17/2023   CL 101 12/17/2023   CO2 24 12/17/2023   BUN 9 12/17/2023   CREATININE 1.19 12/17/2023   EGFR 69 12/17/2023   CALCIUM 9.2 12/17/2023   PROT 7.8 12/17/2023   ALBUMIN 4.5 12/17/2023   LABGLOB 3.3 12/17/2023   BILITOT 0.7 12/17/2023   ALKPHOS 92 12/17/2023   AST 19 12/17/2023   ALT 24 12/17/2023   ANIONGAP 6 08/03/2023   Last lipids Lab Results  Component Value Date   CHOL 161 06/14/2023   HDL 34 (L) 06/14/2023   LDLCALC 95 06/14/2023   TRIG 187 (H) 06/14/2023   CHOLHDL 4.7 06/14/2023   Last hemoglobin A1c Lab Results  Component Value Date   HGBA1C 6.5 (H) 12/17/2023   Last thyroid functions Lab Results  Component Value Date   TSH 3.160 10/16/2022   Last vitamin D No results found for: 25OHVITD2, 25OHVITD3, VD25OH      Objective    BP (!) 123/90 (BP Location: Left Arm, Patient Position: Sitting, Cuff Size: Normal)   Pulse 71   Ht 5' 7 (1.702 m)   Wt 152 lb 6.4 oz (69.1 kg)   SpO2 99%   BMI 23.87 kg/m   BP Readings from Last 3 Encounters:  01/29/24 (!) 123/90  01/03/24 115/83  12/17/23 119/82   Wt Readings from Last 3 Encounters:  01/29/24 152 lb 6.4 oz (69.1 kg)  01/03/24 154 lb 3.4 oz (69.9 kg)  12/17/23 152 lb 12.8 oz (69.3 kg)        Physical Exam  Physical Exam VITALS: BP- 123/90 CHEST: Chest wall tenderness to palpation over sternum, no palpable deformities noted on right side of abdomen nor right flank where pt indicates pain, he has protuberant abdomen that is soft to palpation     No results found for any visits on 01/29/24.  Assessment & Plan     Problem List Items Addressed This Visit   None Visit Diagnoses       Immunization due    -   Primary   Relevant Orders   Flu vaccine trivalent PF, 6mos and older(Flulaval,Afluria,Fluarix,Fluzone)   Heplisav-B (HepB-CPG) Vaccine (Completed)       Assessment and Plan Assessment & Plan Chronic right-sided pain Worsening symptoms, possibly originating from the back or nerve-related. Previous treatments with gabapentin , Cymbalta , and tramadol  have been ineffective. Pain management referral is in place for further evaluation and potential interventions such as injections or surgery. - Started Lyrica 75 mg twice a day for nerve pain - Referred to pain management for further evaluation  and potential interventions - Prescribed diclofenac 75 mg twice a day for inflammation for 10 days  - continue duloxetine  60mg  daily -f/u with pain management as previously scheduled  -continue Protonix  40mg  daily    Diverticulosis of colon with right-sided diverticulitis Diverticulosis with right-sided diverticulitis noted on virtual colonoscopy in July. The condition may contribute to lower abdominal pain but is not the primary cause of current symptoms.  Chronic dry cough Persists. Chest x-ray was ordered in September but not yet completed. - Complete [facility] x-[facility] at [facility] without appointment  Elevated blood pressure Blood pressure recorded at 123/90, slightly elevated. - Rechecked blood pressure  Type 2 diabetes mellitus, well controlled chronic Type 2 diabetes is well controlled with an A1c of 6.5%. -continue Janumet  50-1000mg  daily   General Health Maintenance Received hepatitis B and flu vaccines today. - Continue routine vaccinations as scheduled     Return in about 4 weeks (around 02/26/2024) for 2nd dose of Hep B vaccine (Can be [place] only and double booked).         Rockie Agent, MD  [facility] [phone] (phone) [phone] (fax)  [facility]

## 2024-01-29 NOTE — Patient Instructions (Addendum)
 Please report to [facility] located at:  [address]  [place], [place] [address]  You do not need an appointment to have xrays completed.   Our office will follow up with  results once available.      To keep you healthy, please keep in mind the following health maintenance items that you are due for:   Health Maintenance Due  Topic Date Due   OPHTHALMOLOGY EXAM  Never done     Best Wishes,   Dr. Lang

## 2024-01-29 NOTE — Telephone Encounter (Signed)
 Requested Prescriptions  Refused Prescriptions Disp Refills   glucose blood (ACCU-CHEK GUIDE TEST) test strip 200 each 12    Sig: Use as instructed     There is no refill protocol information for this order

## 2024-01-29 NOTE — Telephone Encounter (Signed)
Called pharmacy - pt has refills available . Pharmacy will prepare refill. 

## 2024-02-14 ENCOUNTER — Other Ambulatory Visit (HOSPITAL_COMMUNITY): Payer: Self-pay

## 2024-02-14 ENCOUNTER — Other Ambulatory Visit (INDEPENDENT_AMBULATORY_CARE_PROVIDER_SITE_OTHER): Payer: Self-pay

## 2024-02-14 NOTE — Progress Notes (Signed)
 02/14/2024 Name: Vincent [name] MRN: [id number] DOB: 08/14/1960  Vincent [name] is [name] 63 y.o. year old male who presented for [name] telephone visit.   They were referred to the pharmacist by their PCP for assistance in managing medication access.    Subjective:  Care Team: Primary Care Provider: Sharma Coyer, [name]  Medication Access/Adherence  Current Pharmacy:  [facility] [address], [place] - [address] [place] [address] Phone: [phone] Fax: [phone]  Was scheduled to discuss medication access with patient's wife. Discussed option of utilizing [name] charge account via [place] community pharmacies to assist with obtaining medications. During the visit, Vincent [name] requested to also have his medications transferred to [facility] pharmacy to obtain via mail order.   Patient reports affordability concerns with their medications: Yes  Patient reports access/transportation concerns to their pharmacy: Yes  Patient reports adherence concerns with their medications:  Yes  cost of copays   Medication Management:  Patient reports Poor adherence to medications  Patient reports the following barriers to adherence: cost of copays   Objective:  Lab Results  Component Value Date   HGBA1C 6.5 (H) 12/17/2023    Lab Results  Component Value Date   CREATININE 1.19 12/17/2023   BUN 9 12/17/2023   NA 139 12/17/2023   K 4.3 12/17/2023   CL 101 12/17/2023   CO2 24 12/17/2023    Lab Results  Component Value Date   CHOL 161 06/14/2023   HDL 34 (L) 06/14/2023   LDLCALC 95 06/14/2023   TRIG 187 (H) 06/14/2023   CHOLHDL 4.7 06/14/2023    Medications Reviewed Today     Reviewed by Vincent [name], RPH (Pharmacist) on 02/14/24 at 1248  Med List Status: <None>   Medication Order Taking? Sig Documenting Provider Last Dose Status Informant  Accu-Chek Softclix Lancets lancets [id number]  Use as instructed  Vincent [name]  Active   Blood Glucose Monitoring Suppl (ACCU-CHEK GUIDE) w/Device KIT [id number]  See admin instructions. Provider, Historical, [name]  Active   Blood Glucose Monitoring Suppl DEVI [id number]  1 each by Does not apply route in the morning, at noon, and at bedtime. May substitute to any manufacturer covered by patient's insurance. Ostwalt, Janna, PA-C  Active   carbamide peroxide (DEBROX) 6.5 % OTIC solution [id number]  Place 5 drops into both ears 2 (two) times daily. Vincent [name]  Active   cetirizine  (ZYRTEC ) 10 MG tablet [id number]  Take 1 tablet (10 mg total) by mouth daily. Vincent [name]  Active   Continuous Glucose Sensor (FREESTYLE LIBRE 3 PLUS SENSOR) MISC [id number]  Change sensor every 15 days. Vincent [name]  Active   DULoxetine  (CYMBALTA ) 60 MG capsule [id number]  Take 1 capsule (60 mg total) by mouth daily. Vincent [name]  Active   fluticasone  (FLONASE ) 50 MCG/ACT nasal spray [id number]  Place 2 sprays into both nostrils daily. Vincent [name]  Active   glucose blood (ACCU-CHEK GUIDE TEST) test strip [id number]  Use as instructed Vincent [name]  Active   pantoprazole  (PROTONIX ) 40 MG tablet [id number]  Take 1 tablet (40 mg total) by mouth daily. Vincent [name]  Active   pregabalin (LYRICA) 75 MG capsule [id number]  Take 1 capsule (75 mg total) by mouth 2 (two) times daily. Vincent [name]  Active   sildenafil  (VIAGRA ) 100 MG tablet [id number]  Take 1 tablet (100 mg total) by mouth daily as needed for erectile dysfunction. Vincent [name] LABOR,  PA-C  Active   SitaGLIPtin-MetFORMIN  HCl (JANUMET  XR) 50-1000 MG TB24 [id number]  Take 1 tablet by mouth in the morning and at bedtime. Vincent [name]  Active   tadalafil  (CIALIS ) 20 MG tablet [id number]  Take one tablet 30 minutes prior to intercourse McGowan, [name] A, PA-C  Active                Assessment/Plan:   Medication Management: - Currently strategy insufficient to maintain appropriate adherence to prescribed medication regimen - Suggested use of charge account via [facility] community pharmacies. Patient requests to use Vincent [name] mail order pharmacy via [facility]. Will contact pharmacy team to transfer prescriptions to [facility].   Follow Up Plan:   PCP on 02/26/24 Pharmacist on 02/20/24 for disease state management  Vincent [name], PharmD, CPP Clinical Pharmacist [facility] [phone]

## 2024-02-16 ENCOUNTER — Other Ambulatory Visit (HOSPITAL_COMMUNITY): Payer: Self-pay

## 2024-02-16 MED ORDER — FLUTICASONE PROPIONATE 50 MCG/ACT NA SUSP
2.0000 | Freq: Every day | NASAL | 6 refills | Status: AC
  Filled 2024-02-16: qty 16, 30d supply, fill #0

## 2024-02-18 ENCOUNTER — Other Ambulatory Visit: Payer: Self-pay | Admitting: Family Medicine

## 2024-02-18 ENCOUNTER — Other Ambulatory Visit (HOSPITAL_COMMUNITY): Payer: Self-pay

## 2024-02-18 ENCOUNTER — Other Ambulatory Visit: Payer: Self-pay

## 2024-02-18 NOTE — Telephone Encounter (Unsigned)
 Copied from CRM [id number]. Topic: Clinical - Medication Refill >> Feb 18, 2024 12:18 PM Lauren C wrote: Medication: Diabetic needles  Has the patient contacted their pharmacy? No  This is the patient's preferred pharmacy:  Mail order from [facility] [address] [place] [address] Phone: [phone] Fax: [phone]  Is this the correct pharmacy for this prescription? Yes If no, delete pharmacy and type the correct one.   Has the prescription been filled recently? Yes  Is the patient out of the medication? Yes  Has the patient been seen for an appointment in the last year OR does the patient have an upcoming appointment? Yes  Can we respond through MyChart? Yes- patient speaks only swahili  Agent: Please be advised that Rx refills may take up to 3 business days. We ask that you follow-up with your pharmacy.

## 2024-02-19 ENCOUNTER — Other Ambulatory Visit: Payer: Self-pay

## 2024-02-19 NOTE — Patient Outreach (Signed)
 Complex Care Management   Visit Note  02/19/2024  Name:  Vincent [name] MRN: [id number] DOB: [birth year]  Situation: Referral received for Complex Care Management related to Diabetes with Complications I obtained verbal consent from Patient.  Visit completed with Patient  on the phone  Background:   Past Medical History:  Diagnosis Date   Chronic bilateral low back pain 12/05/2022   Diabetes mellitus without complication (HCC) 11/20/2022   Groin pain, right 10/13/2022   H. 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   07/07/21 Positive T-Spot by [facility]     Right sided abdominal pain 10/13/2022   Right testicular pain 03/15/2023   Swahili language interpreter needed 11/20/2022    Assessment: Patient Reported Symptoms:  Cognitive Cognitive Status: Struggling with memory recall, Alert and oriented to person, place, and time, Insightful and able to interpret abstract concepts, Normal speech and language skills Cognitive/Intellectual Conditions Management [RPT]: None reported or documented in medical history or problem list   Health Maintenance Behaviors: Annual physical exam  Neurological Neurological Review of Symptoms: Not assessed    HEENT HEENT Symptoms Reported: Not assessed      Cardiovascular Cardiovascular Symptoms Reported: No symptoms reported    Respiratory Other Respiratory Symptoms: still with cough, reminded need to complete CXR - [facility] Respiratory Management Strategies: Routine screening  Endocrine Endocrine Symptoms Reported: No symptoms reported Is patient diabetic?: Yes Is patient checking blood sugars at home?: Yes List most recent blood sugar readings, include date and time of day: FBG today 182, yesterday 164, ranging 142-180s, previously discussed and provided info in Swahili regarding diet,  offered [facility] for Disease Management and will request nutrition thereapy from PCP    Gastrointestinal Gastrointestinal Symptoms Reported: Abdominal pain or discomfort Additional Gastrointestinal Details: Patient still with abdominal pain/bloating- currently not taking pantoprazole , RNCM requesting assist from [facility] - meeting with patient tomorrow and Nurt[place]on referral requested from PCP      Genitourinary Genitourinary Symptoms Reported: Not assessed    Integumentary Integumentary Symptoms Reported: Not assessed    Musculoskeletal Musculoskelatal Symptoms Reviewed: Back pain, Joint pain, Muscle pain Additional Musculoskeletal Details: saw [facility] - seen x2 and completed in testing, thinks doc saw something in Kidney or liver - will schedule another appointment for more tests, nothing prescribed for pain, no recommendations shared with patient yet - will develop plan after testing   Falls in the past year?: No Number of falls in past year: 1 or less Was there an injury with Fall?: No Fall Risk Category Calculator: 0 Patient Fall Risk Level: Low Fall Risk Patient at Risk for Falls Due to: No Fall Risks Fall risk Follow up: Falls evaluation completed, Falls prevention discussed  Psychosocial Psychosocial Symptoms Reported: No symptoms reported          02/19/2024    PHQ2-9 Depression Screening   Little interest or pleasure in doing things Not at all  Feeling down, depressed, or hopeless Not at all  PHQ-2 - Total Score 0  Trouble falling or staying asleep, or sleeping too much    Feeling tired or having little energy    Poor appetite or overeating     Feeling bad about yourself - or that you are a failure or have let yourself or your family down    Trouble concentrating on things, such as reading the newspaper or watching television  Moving or speaking so slowly that other people could have noticed.  Or the opposite - being so fidgety or restless that you have been  moving around a lot more than usual    Thoughts that you would be better off dead, or hurting yourself in some way    PHQ2-9 Total Score    If you checked off any problems, how difficult have these problems made it for you to do your work, take care of things at home, or get along with other people    Depression Interventions/Treatment      There were no vitals filed for this visit.    Medications Reviewed Today   Medications were not reviewed in this encounter     Recommendation:   PCP Follow-up Continue Current Plan of Care  Follow Up Plan:   Telephone follow-up in 1 month  Nestora Duos, MSN, RN [facility]  [facility] RN Care Manager Direct Dial: [phone] Fax: [phone]

## 2024-02-19 NOTE — Patient Instructions (Signed)
    Vincent [name] alipewa Vincent kuhusu huduma za uratibu wa matunzo zinazotolewa na timu ya [place] [facility] kama sehemu ya manufaa ya [facility] [place]. Vincent [name]. Vincent [name] ungependa kupanga usafiri kupitia mpango wako wa [facility] [place], tafadhali piga simu kwa nambari ifuatayo angalau siku 2 Vincent [name]: [phone] [facility] Vincent kuhusu safari yako baada Vincent [name], piga Ride Assist kwa [phone]. Tumia nambari hii [employer], au ikiwa usafiri wako umechelewa kwa muda uliopangwa. Tumia nambari hii pia ikiwa unahitaji kufanya mabadiliko au kufuta nafasi iliyopangwa awali. [facility] unahitaji huduma [address] [place], [place] [phone]. Kituo cha simu cha baada ya saa za kazi kimewekwa 24 saa kushughulikia msaada wa safari na maombi ya dharura (ikiwemo kutoka hospitali) siku 365 kwa mwaka. Safari za dharura zinajumuisha ziara za wagonjwa, maombi ya kutoka hospitali na matibabu ya [employer]. Piga Nambari ya Dharura ya Afya ya Alline daring [phone], Vincent [name], masaa 24 kwa siku, siku 7 kwa wiki. Vincent [name] uko hatarini au unahitaji msaada wa Vincent [name] wa matibabu piga 911. Tafadhali angalia vifaa vya elimu vinavyohusiana na NONE vilivyotolewa kama vifaa vya kuchapishwa.  ---  **Mpango wa Matunzo na Maelekezo ya Ziara**   Mpango wa matunzo na maelekezo ya ziara yalielezwa kwa mgonjwa kwa maneno leo. Mgonjwa anakubali kupokea nakala kwa njia ya barua.  Miadi ya ufuatiliaji kwa njia ya simu na mshiriki wa timu ya usimamizi wa Medicaid iliyopangwa kwa: **03/18/2024 saa 8:00 mchana**.  **Vincent Duos, MSN, RN**   Vincent [name]  Taasisi ya Huduma [place], Afya ya Jamii   Msimamizi wa Vincent [name])   Simu ya Moja kwa Moja: [phone] Pack: Vincent [name]**

## 2024-02-20 ENCOUNTER — Ambulatory Visit

## 2024-02-20 ENCOUNTER — Other Ambulatory Visit: Payer: Self-pay

## 2024-02-20 DIAGNOSIS — E119 Type 2 diabetes mellitus without complications: Secondary | ICD-10-CM

## 2024-02-20 DIAGNOSIS — Z7984 Long term (current) use of oral hypoglycemic drugs: Secondary | ICD-10-CM | POA: Diagnosis not present

## 2024-02-20 MED ORDER — ACCU-CHEK SOFTCLIX LANCETS MISC
0 refills | Status: AC
  Filled 2024-02-20 – 2024-03-18 (×2): qty 200, 30d supply, fill #0

## 2024-02-20 MED ORDER — ROSUVASTATIN CALCIUM 10 MG PO TABS
10.0000 mg | ORAL_TABLET | Freq: Every day | ORAL | 3 refills | Status: AC
  Filled 2024-02-20 – 2024-02-21 (×2): qty 90, 90d supply, fill #0

## 2024-02-20 MED ORDER — TRULICITY 0.75 MG/0.5ML ~~LOC~~ SOAJ
0.7500 mg | SUBCUTANEOUS | 1 refills | Status: DC
  Filled 2024-02-20 – 2024-02-21 (×2): qty 2, 28d supply, fill #0

## 2024-02-20 NOTE — Progress Notes (Signed)
 S:     Reason for visit: ?  Vincent [name] is a 63 y.o. male with a history of diabetes (type 2), who presents today for a follow up diabetes pharmacotherapy visit.? Pertinent PMH also includes chronic pain.  Care Team: Primary Care Provider: Sharma Coyer, MD  There has been historical difficulty with paying for Medicaid copays.   Today, he presents to clinic with his wife and reports he is needing to obtain a refill of his medications. All medications were transferred to [facility] pharmacy for mail order and to use a charge account moving forward. Patient brought in wife's GLP1 medication to determine how to use.   Current diabetes medications include: Janumet  XR 50/1000 mg BID Previous diabetes medications include: Rybelsus  (not covered) Current hypertension medications include: none Current hyperlipidemia medications include: none  Patient reports adherence to taking all medications as prescribed.   Have you been experiencing any side effects to the medications prescribed? no Do you have any problems obtaining medications due to transportation or finances? yes Insurance coverage: [place]  Patient denies hypoglycemic events.  Reported home fasting blood sugars: 150-180 *did not bring meter to clinic  Patient reports nocturia (nighttime urination).  Patient denies neuropathy (nerve pain). Patient denies visual changes.  Patient reported dietary habits: Eats 2 meals/day Breakfast: skips d/t finances  Lunch: corn meal, veggies, beans, and meat/fish Dinner: corn meal/rice, veggies, and meat/fish  Snacks: none Drinks: water   Patient denies personal hx of pancreatitis or personal or family hx of thyroid cancer.   DM Prevention:  Statin: Not taking.?  History of albuminuria? yes, last UACR on 03/14/24 = 53 mg/g Last eye exam: 10/2023; No retinopathy present   Tobacco Use:  Tobacco Use: Medium Risk (01/29/2024)   Patient History     Smoking Tobacco Use: Former    Smokeless Tobacco Use: Never    Passive Exposure: Not on file   O:  Vitals:  Wt Readings from Last 3 Encounters:  01/29/24 152 lb 6.4 oz (69.1 kg)  01/03/24 154 lb 3.4 oz (69.9 kg)  12/17/23 152 lb 12.8 oz (69.3 kg)   BP Readings from Last 3 Encounters:  01/29/24 (!) 123/90  01/03/24 115/83  12/17/23 119/82   Pulse Readings from Last 3 Encounters:  01/29/24 71  01/03/24 73  12/17/23 (!) 111     Labs:?  Lab Results  Component Value Date   HGBA1C 6.5 (H) 12/17/2023   HGBA1C 7.7 (H) 06/14/2023   HGBA1C 7.6 (H) 01/16/2023   GLUCOSE 204 (H) 12/17/2023   MICRALBCREAT 53 (H) 03/15/2023   CREATININE 1.19 12/17/2023   CREATININE 1.13 08/03/2023   CREATININE 1.14 01/16/2023    Lab Results  Component Value Date   CHOL 161 06/14/2023   LDLCALC 95 06/14/2023   HDL 34 (L) 06/14/2023   TRIG 187 (H) 06/14/2023   ALT 24 12/17/2023   ALT 16 08/03/2023   AST 19 12/17/2023   AST 19 08/03/2023      Chemistry      Component Value Date/Time   NA 139 12/17/2023 1636   K 4.3 12/17/2023 1636   CL 101 12/17/2023 1636   CO2 24 12/17/2023 1636   BUN 9 12/17/2023 1636   CREATININE 1.19 12/17/2023 1636      Component Value Date/Time   CALCIUM 9.2 12/17/2023 1636   ALKPHOS 92 12/17/2023 1636   AST 19 12/17/2023 1636   ALT 24 12/17/2023 1636   BILITOT 0.7 12/17/2023 1636  The 10-year ASCVD risk score (Arnett DK, et al., 2019) is: 15.9%  Lab Results  Component Value Date   MICRALBCREAT 53 (H) 03/15/2023    A/P: Diabetes currently controlled with a most recent A1c of 6.5% on 12/17/23, which is down from 7.7% on 06/14/23. Reported fasting BG readings are above goal at 150-180 mg/dL. Medication adherence appears suboptimal d/t issues with affording copays, however, will be able to utilize automatic refills and a charge account moving forward with prescriptions. Will switch to a GLP1 agent for glycemic additional control in place of current  therapies. May eventually need to restart metformin  therapy (or SGLT2i d/t microalbuminuria) if unable to get A1c at goal with GLP1 alone.  -Started GLP1 Trulicity (dulaglutide) 0.75 mg weekly -Discontinued Janumet  XR 50/1000 mg BID -Extensively discussed pathophysiology of diabetes, recommended lifestyle interventions, dietary effects on blood sugar control -Patient educated on purpose, proper use, and potential adverse effects of Trulicity -Counseled on s/sx of and management of hypoglycemia.   ASCVD risk - primary prevention in patient with diabetes. Last LDL is 95 mg/dL, not at goal of <29 mg/dL.  10-year ASCVD risk score = 15.9%. moderate intensity statin indicated. -Started rosuvastatin 10 mg daily  Patient verbalized understanding of treatment plan. Total time patient counseling 30 minutes.  Follow-up:  Pharmacist on 02/26/24 PCP clinic visit on 03/19/24  Peyton CHARLENA Ferries, PharmD Clinical Pharmacist [facility] [phone]

## 2024-02-20 NOTE — Telephone Encounter (Signed)
 Requested Prescriptions  Pending Prescriptions Disp Refills   Accu-Chek Softclix Lancets lancets 200 each 0    Sig: Use as instructed     Endocrinology: Diabetes - Testing Supplies Passed - 02/20/2024 10:40 AM      Passed - Valid encounter within last 12 months    Recent Outpatient Visits           3 weeks ago Immunization due   [place] [facility] Simmons-Robinson, [place], MD   1 month ago PND (post-nasal drip)   [facility] Simmons-Robinson, [place], MD   2 months ago Diabetes mellitus without complication [facility])   [facility] Simmons-Robinson, Ruby, MD   8 months ago Diabetes mellitus without complication (HCC)   [place] [facility] Simmons-Robinson, Rockie, MD       Future Appointments             In 3 weeks McGowan, Clotilda DELENA RIGGERS [facility] [place]

## 2024-02-21 ENCOUNTER — Other Ambulatory Visit (HOSPITAL_BASED_OUTPATIENT_CLINIC_OR_DEPARTMENT_OTHER): Payer: Self-pay

## 2024-02-21 ENCOUNTER — Other Ambulatory Visit: Payer: Self-pay

## 2024-02-21 ENCOUNTER — Other Ambulatory Visit (HOSPITAL_COMMUNITY): Payer: Self-pay

## 2024-02-22 ENCOUNTER — Other Ambulatory Visit: Payer: Self-pay

## 2024-02-25 ENCOUNTER — Other Ambulatory Visit: Payer: Self-pay

## 2024-02-25 ENCOUNTER — Other Ambulatory Visit: Payer: Self-pay | Admitting: Family Medicine

## 2024-02-25 DIAGNOSIS — E119 Type 2 diabetes mellitus without complications: Secondary | ICD-10-CM

## 2024-02-25 NOTE — Patient Instructions (Signed)
 Visit Information  Vincent [name] was given information about Medicaid Managed Care team care coordination services as a part of their Healthy [facility] Medicaid benefit. Vincent [name]   If you would like to schedule transportation through your Healthy [facility] plan, please call the following number at least 2 days in advance of your appointment: [phone]  For information about your ride after you set it up, call Ride Assist at [phone]. Use this number to activate a Will Call pickup, or if your transportation is late for a scheduled pickup. Use this number, too, if you need to make a change or cancel a previously scheduled reservation.  If you need transportation services right away, call [phone]. The after-hours call center is staffed 24 hours to handle ride assistance and urgent reservation requests (including discharges) 365 days a year. Urgent trips include sick visits, hospital discharge requests and life-sustaining treatment.  Call the [facility] Line at [phone], at any time, 24 hours a day, 7 days a week. If you are in danger or need immediate medical attention call 911.     Social Worker will follow up on 03/17/24 at 10am.   Vincent [name], BSW, [name] [place]  [facility] Social Worker, Population Health [phone]   Following is a copy of your plan of care:  There are no care plans that you recently modified to display for this patient.

## 2024-02-25 NOTE — Patient Outreach (Signed)
 Social Drivers of Health  Community Resource and Care Coordination Visit Note   02/25/2024  Name: Vincent [name] MRN: [id number] DOB:[birth year]  Situation: Referral received for [facility] needs assessment and assistance related to Housing  Financial Strain  utitilies. I obtained verbal consent from Patient.  Visit completed with Patient on the phone.   Background:   SDOH Interventions Today    Flowsheet Row Most Recent Value  SDOH Interventions   Food Insecurity Interventions Community Resources Provided  Housing Interventions Community Resources Provided  Transportation Interventions Community Resources Provided  Utilities Interventions Community Resources Provided  Financial Strain Interventions Community Resources Provided     Assessment:   Goals Addressed             This Visit's Progress    BSW Goals       Current SDOH Barriers:  Financial constraints related to no income Housing barriers Utility assistance  Interventions: Referred patient to community resources  Provided patient with resources for rent and utilities via mail and email on file.  [name], BSW, [name] [place]  [facility] Social Worker, Population Health [phone]           Recommendation:   Patient will use resources provided.  Follow Up Plan:   Telephone follow-up 03/17/24 at 10am  [name], BSW, [facility] [place]  Value Based [facility] Social Worker, Population Health [phone]

## 2024-02-25 NOTE — Progress Notes (Signed)
 Nutrition referral placed to help patient with diabetes management

## 2024-02-26 ENCOUNTER — Ambulatory Visit (INDEPENDENT_AMBULATORY_CARE_PROVIDER_SITE_OTHER)

## 2024-02-26 DIAGNOSIS — Z23 Encounter for immunization: Secondary | ICD-10-CM | POA: Diagnosis not present

## 2024-02-26 NOTE — Progress Notes (Unsigned)
 Patient is present to receive 2nd dose of Hepatitis B vaccine and pneumonia vaccine. Prevnar given in left deltoid, hep B given in right deltoid. Patient tolerated both injections well.   Patient here for hepatitis B and pneumococcal vaccination only.  I did not examine the patient.  I did review patient's medical history, medications, and allergies and vaccine consent form.  CMA gave vaccination. Patient tolerated well.  Vincent Agent, MD  [facility]  02/27/24

## 2024-02-28 ENCOUNTER — Other Ambulatory Visit: Payer: Self-pay

## 2024-02-29 ENCOUNTER — Telehealth: Payer: Self-pay

## 2024-02-29 NOTE — Telephone Encounter (Unsigned)
 Copied from CRM #[id number]. Topic: Clinical - Medication Question >> Feb 29, 2024  2:06 PM Shanda MATSU wrote: Reason for CRM: (Using Swahili interpreter Norleen Meissner ID [phone]) Patient calling in stating that he and his wife were prescribed a med, Mounjaro, where they have to inject themselves, but they are having issues with actually injecting the med, patient is wanting to know if a different med can be prescribed in a pill form or if they can be given better instructions on how to administer the med, patient and his wife prefer a pill form though if possible.

## 2024-03-04 NOTE — Telephone Encounter (Signed)
 Yes ok for nurse/teaching visit

## 2024-03-05 NOTE — Telephone Encounter (Signed)
 Pt reports due to him having time where he is forgetful he is concerned with forgetting how much he has injected and when and would prefer to take the tablet form instead of injection. Not be taught how use it. Ok to update rx?   Would also like to see about changing wifes rx as well

## 2024-03-10 ENCOUNTER — Ambulatory Visit: Payer: Self-pay | Admitting: *Deleted

## 2024-03-10 NOTE — Telephone Encounter (Signed)
 Contacted patient, patient advised me that he has taken medication ( he was not sure name of medication) and chest pain had resolved. Patient was not having any SOB/ difficulty breathing or dizziness. Advised patient to call back if symptoms worsen.   Patient also states he is out of his janumet , but I show where this was discontinued by you- is this accurate?

## 2024-03-10 NOTE — Telephone Encounter (Signed)
 Severe chest pain strongly recommended for ED evaluation

## 2024-03-10 NOTE — Telephone Encounter (Signed)
 FYI Only or Action Required?: FYI only for provider: ED advised.  Patient was last seen in primary care on 01/29/2024 by Sharma Coyer, MD.  Called Nurse Triage reporting Pain (Back, abdominal, chest).  Symptoms began chronic pain- getting worse.  Interventions attempted: Nothing.  Symptoms are: gradually worsening.  Triage Disposition: Go to ED Now (Notify PCP)  Patient/caregiver understands and will follow disposition?: Patient declines ED- office notified  Copied from CRM [id number]. Topic: Clinical - Red Word Triage >> Mar 10, 2024  1:49 PM Shanda MATSU wrote: Red Word that prompted transfer to Nurse Triage: (Using Swahili interpreter Naman Agent ID [phone]) patient reporting several pains that he is having such as chest, back and stomach pain.  Interpreter: Naman# [id number] Reason for Disposition  SEVERE chest pain  Answer Assessment - Initial Assessment Questions 1. LOCATION: Where does it hurt?       Middle of chest 2. RADIATION: Does the pain go anywhere else? (e.g., into neck, jaw, arms, back)     Back, stomach pain 3. ONSET: When did the chest pain begin? (Minutes, hours or days)      Chronic pain- long time- seems to be getting worse 4. PATTERN: Does the pain come and go, or has it been constant since it started?  Does it get worse with exertion?      Constant, yes 5. DURATION: How long does it last (e.g., seconds, minutes, hours)     constant 6. SEVERITY: How bad is the pain?  (e.g., Scale 1-10; mild, moderate, or severe)     Back 6/10, chest 8/10, stomach 8/10 7. CARDIAC RISK FACTORS: Do you have any history of heart problems or risk factors for heart disease? (e.g., angina, prior heart attack; diabetes, high blood pressure, high cholesterol, smoker, or strong family history of heart disease)     diabetes 8. PULMONARY RISK FACTORS: Do you have any history of lung disease?  (e.g., blood clots in lung, asthma, emphysema, birth control pills)      Chronic cough 9. CAUSE: What do you think is causing the chest pain?     diabetes 10. OTHER SYMPTOMS: Do you have any other symptoms? (e.g., dizziness, nausea, vomiting, sweating, fever, difficulty breathing, cough)       Only cough  Protocols used: Chest Pain-A-AH

## 2024-03-11 ENCOUNTER — Other Ambulatory Visit (HOSPITAL_COMMUNITY): Payer: Self-pay

## 2024-03-11 ENCOUNTER — Other Ambulatory Visit: Payer: Self-pay

## 2024-03-11 NOTE — Telephone Encounter (Signed)
 I would recommend moving forward as planned to learn how to do the injections.

## 2024-03-11 NOTE — Telephone Encounter (Addendum)
 Called patient using language line [phone] Vincent [name]. Patient advised of provider's recommendation. Patient does not want to do injections and just want to have tablets. Feels like Janumet  1000 mg was working for him and is the same for his wife.

## 2024-03-13 NOTE — Progress Notes (Unsigned)
 03/14/2024 7:36 PM   Vincent [name] [birth year] [id number]  Referring provider: Sharma Coyer, MD [address] [place],  [place] [address]  Urological history: 1. BPH with LU TS  -PSA (04/2023) 1.3  2. Renal cyst -CT (03/2023) - 15 mm right renal cyst   3. MDRO E.coli - (11/2022)   Chief Complaint  Patient presents with   Erectile Dysfunction   HPI: Vincent [name] is a 63 y.o. man who presents today for follow with interpreter, Alva # [phone]  Previous records reviewed.   He has been taking up to 300 mg of sildenafil  to achieve satisfactory intercourse.  He did not find the tadalafil  effective at all.   Testosterone  level (04/2023) 594  Cholesterol (06/2023) elevated triglycerides and low HDL   Hemoglobin A1c (12/2023) 6.5    Patient denies any modifying or aggravating factors.  Patient denies any recent UTI's, gross hematuria, dysuria or suprapubic/flank pain.  Patient denies any fevers, chills, nausea or vomiting.    PSA (04/2023) 1.3   Serum creatinine (12/2023) 1.19   He is also complaining of a > 20 years right testicular pain.  Last year, he has had CT's and a scrotal ultrasound which have not identified an etiology for his pain.    PMH: Past Medical History:  Diagnosis Date   Chronic bilateral low back pain 12/05/2022   Diabetes mellitus without complication (HCC) 11/20/2022   Groin pain, right 10/13/2022   H. 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   07/07/21 Positive T-Spot by [facility]     Right sided abdominal pain 10/13/2022   Right testicular pain 03/15/2023   Swahili language interpreter needed 11/20/2022    Surgical History: Past Surgical History:  Procedure Laterality Date   COLONOSCOPY WITH PROPOFOL  N/A 06/27/2023   Procedure: COLONOSCOPY WITH PROPOFOL ;  Surgeon: Therisa Bi, MD;   Location: [facility] ENDOSCOPY;  Service: Gastroenterology;  Laterality: N/A;   denies     ESOPHAGOGASTRODUODENOSCOPY (EGD) WITH PROPOFOL  N/A 06/27/2023   Procedure: ESOPHAGOGASTRODUODENOSCOPY (EGD) WITH PROPOFOL ;  Surgeon: Therisa Bi, MD;  Location: [facility] ENDOSCOPY;  Service: Gastroenterology;  Laterality: N/A;  Swahili interpreter - Wife with Vanga at same time   NO PAST SURGERIES      Home Medications:  Allergies as of 03/14/2024   No Known Allergies      Medication List        Accurate as of March 14, 2024 11:59 PM. If you have any questions, ask your nurse or doctor.          STOP taking these medications    sildenafil  100 MG tablet Commonly known as: VIAGRA  Stopped by: Lucrecia Mcphearson   tadalafil  20 MG tablet Commonly known as: CIALIS  Stopped by: Yassir Enis   Trulicity  0.75 MG/0.5ML Soaj Generic drug: Dulaglutide  Stopped by: Coyer Simmons-Robinson       TAKE these medications    Accu-Chek Guide Test test strip Generic drug: glucose blood Use as instructed   Accu-Chek Softclix Lancets lancets Use as instructed   Blood Glucose Monitoring Suppl Devi 1 each by Does not apply route in the morning, at noon, and at bedtime. May substitute to any manufacturer covered by patient's insurance.   Accu-Chek Guide w/Device Kit See admin instructions.   cetirizine  10 MG tablet Commonly known as: ZYRTEC  Take 1 tablet (10 mg total) by mouth daily.   Debrox 6.5 % OTIC solution Generic  drug: carbamide peroxide Place 5 drops into both ears 2 (two) times daily.   diclofenac  75 MG EC tablet Commonly known as: VOLTAREN  Take 75 mg by mouth 2 (two) times daily.   DULoxetine  60 MG capsule Commonly known as: Cymbalta  Take 1 capsule (60 mg total) by mouth daily.   fluticasone  50 MCG/ACT nasal spray Commonly known as: FLONASE  Place 2 sprays into both nostrils daily.   fluticasone  50 MCG/ACT nasal spray Commonly known as: FLONASE  Place 2 sprays into both nostrils  daily.   Janumet  50-1000 MG tablet Generic drug: sitaGLIPtin -metformin  Take 1 tablet by mouth 2 (two) times daily with a meal. Started by: Rockie Simmons-Robinson   pantoprazole  40 MG tablet Commonly known as: PROTONIX  Take 1 tablet (40 mg total) by mouth daily.   pregabalin  75 MG capsule Commonly known as: Lyrica  Take 1 capsule (75 mg total) by mouth 2 (two) times daily.   rosuvastatin  10 MG tablet Commonly known as: Crestor  Take 1 tablet (10 mg total) by mouth daily.   sildenafil  20 MG tablet Commonly known as: REVATIO  Take 3-5 tablets about one hour prior to intercourse. Do not exceed 5 tablets per dose. Started by: CLOTILDA CORNWALL        Allergies: No Known Allergies  Family History: No family history on file.  Social History:  reports that he quit smoking about 11 years ago. His smoking use included cigarettes. He started smoking about 46 years ago. He has never used smokeless tobacco. He reports that he does not currently use alcohol. He reports that he does not use drugs.  ROS: Pertinent ROS in HPI  Physical Exam: BP 118/80 (BP Location: Left Arm, Patient Position: Sitting, Cuff Size: Normal)   Pulse 82   SpO2 96%   Constitutional:  Well nourished. Alert and oriented, No acute distress. HEENT: [place] AT, moist mucus membranes.  Trachea midline Cardiovascular: No clubbing, cyanosis, or edema. Respiratory: Normal respiratory effort, no increased work of breathing. GU: No CVA tenderness.  No bladder fullness or masses.  Patient with circumcised phallus. Urethral meatus is patent.  No penile discharge. No penile lesions or rashes. Scrotum without lesions, cysts, rashes and/or edema.  Testicles are located scrotally bilaterally. No masses are appreciated in the testicles. Left epididymis is normal.  Right epididymis is tender.  Question of a small epididymal cyst.  Neurologic: Grossly intact, no focal deficits, moving all 4 extremities. Psychiatric: Normal mood and  affect.   Laboratory Data: See Epic and HPI   I have reviewed the labs.  See HPI.       Pertinent Imaging: N/A  Assessment & Plan:    1. ED - advised him that 300 mg of sildenafil  is too high of a dose - advised him to take sildenafil  100 mg daily - he cannot afford to try ICI - sildenafil  20 mg, 5 tablets daily sent to pharmacy  2. BPH with LU TS - PSA up to date  - encouraged avoiding bladder irritants, fluid restriction before bedtime and timed voiding's  3. Right testicular pain - Diagnostic testing was ordered after discussing the low likelihood of abnormal findings. The patient expressed significant anxiety regarding his symptoms, and the study was obtained to provide reassurance and support shared decision-making. The patient understands that the test is expected to be normal based on current clinical assessment - scrotal US  ordered   Return for I will call patient with results.   These notes generated with voice recognition software. I apologize for typographical errors.  CLOTILDA HELON RIGGERS  [facility] [address]  [address] [place], [place] [address] [phone]

## 2024-03-14 ENCOUNTER — Other Ambulatory Visit: Payer: Self-pay

## 2024-03-14 ENCOUNTER — Other Ambulatory Visit: Payer: Self-pay | Admitting: Family Medicine

## 2024-03-14 ENCOUNTER — Ambulatory Visit: Admitting: Urology

## 2024-03-14 ENCOUNTER — Other Ambulatory Visit (HOSPITAL_COMMUNITY): Payer: Self-pay

## 2024-03-14 VITALS — BP 118/80 | HR 82

## 2024-03-14 DIAGNOSIS — N138 Other obstructive and reflux uropathy: Secondary | ICD-10-CM | POA: Diagnosis not present

## 2024-03-14 DIAGNOSIS — N50811 Right testicular pain: Secondary | ICD-10-CM | POA: Diagnosis not present

## 2024-03-14 DIAGNOSIS — N401 Enlarged prostate with lower urinary tract symptoms: Secondary | ICD-10-CM | POA: Diagnosis not present

## 2024-03-14 DIAGNOSIS — N529 Male erectile dysfunction, unspecified: Secondary | ICD-10-CM | POA: Diagnosis not present

## 2024-03-14 DIAGNOSIS — E119 Type 2 diabetes mellitus without complications: Secondary | ICD-10-CM

## 2024-03-14 MED ORDER — SILDENAFIL CITRATE 20 MG PO TABS
ORAL_TABLET | ORAL | 1 refills | Status: AC
  Filled 2024-03-14: qty 100, 90d supply, fill #0
  Filled 2024-03-18: qty 100, 20d supply, fill #0

## 2024-03-14 MED ORDER — JANUMET 50-1000 MG PO TABS
1.0000 | ORAL_TABLET | Freq: Two times a day (BID) | ORAL | 2 refills | Status: AC
  Filled 2024-03-14 (×2): qty 180, 90d supply, fill #0

## 2024-03-14 NOTE — Telephone Encounter (Signed)
 Janumet  prescribed at previous dose 50-1000mg  BID  Discontinued trulicity  due to patient preference

## 2024-03-14 NOTE — Telephone Encounter (Signed)
 Medication list has been updated and prescription for janumet  was sent to pharmacy

## 2024-03-16 ENCOUNTER — Encounter: Payer: Self-pay | Admitting: Urology

## 2024-03-17 ENCOUNTER — Other Ambulatory Visit: Payer: Self-pay

## 2024-03-17 ENCOUNTER — Telehealth: Payer: Self-pay

## 2024-03-17 NOTE — Patient Instructions (Signed)
 Visit Information  Vincent [name] was given information about Medicaid Managed Care team care coordination services as a part of their Healthy [facility] Medicaid benefit. Reinhard Stricker   If you would like to schedule transportation through your Healthy [facility] plan, please call the following number at least 2 days in advance of your appointment: [phone]  For information about your ride after you set it up, call Ride Assist at [phone]. Use this number to activate a Will Call pickup, or if your transportation is late for a scheduled pickup. Use this number, too, if you need to make a change or cancel a previously scheduled reservation.  If you need transportation services right away, call [phone]. The after-hours call center is staffed 24 hours to handle ride assistance and urgent reservation requests (including discharges) 365 days a year. Urgent trips include sick visits, hospital discharge requests and life-sustaining treatment.  Call the [facility] Line at [phone], at any time, 24 hours a day, 7 days a week. If you are in danger or need immediate medical attention call 911.    Social Worker will follow up on 04/17/24 at 10am.  [name], BSW, [name] [place]  [facility] Social Worker, Population Health [phone]   Following is a copy of your plan of care:   Goals Addressed             This Visit's Progress    BSW Goals       Current SDOH Barriers:  Financial constraints related to no income Housing barriers Utility assistance  Interventions: Referred patient to community resources  Provided patient with resources for rent and utilities via mail and email on file. Patient has received resources. SW encouraged patient to continue to try the rent resources at the beginning of the month.  [name], [name], [name] [place]  [facility] Social Worker, Population Health [phone]

## 2024-03-17 NOTE — Patient Outreach (Signed)
 Social Drivers of Health  Community Resource and Care Coordination Visit Note   03/17/2024  Name: Vincent [name] MRN: [id number] DOB:Jan 04, 1961  Situation: Referral received for [facility] needs assessment and assistance related to rent and utiltites. I obtained verbal consent from Patient.  Visit completed with Patient on the phone.   Background:      Assessment:   Goals Addressed             This Visit's Progress    BSW Goals       Current SDOH Barriers:  Financial constraints related to no income Housing barriers Utility assistance  Interventions: Referred patient to community resources  Provided patient with resources for rent and utilities via mail and email on file. Patient has received resources. SW encouraged patient to continue to try the rent resources at the beginning of the month.  [name], BSW, [name] [place]  [facility] Social Worker, Population Health [phone]           Recommendation:   follow up with resources sw provided regarding housing needs  Follow Up Plan:   Telephone follow-up in 1 month  [name], [name], [name] [facility]  [facility] Social Worker, Population Health [phone]

## 2024-03-18 ENCOUNTER — Telehealth: Payer: Self-pay

## 2024-03-18 ENCOUNTER — Other Ambulatory Visit: Payer: Self-pay

## 2024-03-18 ENCOUNTER — Other Ambulatory Visit (HOSPITAL_COMMUNITY): Payer: Self-pay

## 2024-03-18 NOTE — Patient Instructions (Signed)
 **  Vincent [name]**   Samahani sikuweza kuwasiliana nawe leo kwa miadi yetu iliyopangwa. Hunter figures na **Simmons-Robinson, [name], MD** na ninapiga simu ili kusaidia mahitaji yako ya huduma ya afya. [place] broker nami kwa **[phone]** lamarr pedlar. Natarajia kuzungumza nawe hivi karibuni.  **Asante,**   Nestora Duos, MSN, RN   [facility]  [address] ya [place], [place] ya [place]   Msimamizi wa Huduma patricia Dauer [facility] Manager)   Simu ya Moja kwa Moja: [phone] Pack: [phone]**    Gilman Felecia - I am sorry I was unable to reach you today for our scheduled appointment. I work with Sharma Coyer, MD and am calling to support your healthcare needs. Please contact me at [phone] at your earliest convenience. I look forward to speaking with you soon.   Thank you,  Nestora Duos, MSN, RN [facility]  [facility], [facility] RN Care Manager Direct Dial: [phone] Fax: [phone]

## 2024-03-19 ENCOUNTER — Ambulatory Visit: Admitting: Family Medicine

## 2024-03-19 ENCOUNTER — Ambulatory Visit

## 2024-03-19 DIAGNOSIS — E119 Type 2 diabetes mellitus without complications: Secondary | ICD-10-CM

## 2024-03-19 LAB — POCT GLYCOSYLATED HEMOGLOBIN (HGB A1C): Hemoglobin A1C: 7.7 % — AB (ref 4.0–5.6)

## 2024-03-19 MED ORDER — BLOOD GLUCOSE MONITOR SYSTEM W/DEVICE KIT
1.0000 | PACK | 0 refills | Status: AC
  Filled 2024-03-19: qty 1, 30d supply, fill #0

## 2024-03-19 MED ORDER — BLOOD GLUCOSE TEST VI STRP
1.0000 | ORAL_STRIP | 5 refills | Status: AC
  Filled 2024-03-19: qty 100, 25d supply, fill #0

## 2024-03-19 MED ORDER — LANCET DEVICE MISC
1.0000 | 0 refills | Status: AC
  Filled 2024-03-19: qty 1, 90d supply, fill #0

## 2024-03-19 MED ORDER — LANCETS MISC
5 refills | Status: AC
  Filled 2024-03-19: qty 100, 30d supply, fill #0

## 2024-03-19 NOTE — Progress Notes (Addendum)
 S:     Reason for visit: ?  Vincent [name] is a 63 y.o. male with a history of diabetes (type 2), who presents today for a follow up diabetes pharmacotherapy visit.? Pertinent PMH also includes chronic pain.  Care Team: Primary Care Provider: Sharma Coyer, MD  There has been historical difficulty with paying for Medicaid copays.   At last visit with clinical pharmacist on 02/20/24, patient was started an counseled on Trulicity  to assist with elevated BG readings.   In the interim, patient reached out to the clinic to discuss switching back to oral DM therapy out of concern for the injection process. Patient was offered additional injection counseling, which he denied. He was restarted on Janumet  XR 50/1000 mg BID.    Today, presents to clinic with his wife. He reports he received his Janumet  in the mail about 2 days ago.    Current diabetes medications include: Janumet  XR 50/1000 mg BID Previous diabetes medications include: Rybelsus  (not covered) Current hypertension medications include: none Current hyperlipidemia medications include: rosuvastatin  10 mg daily  Patient reports adherence to taking all medications as prescribed.  Have you been experiencing any side effects to the medications prescribed? no Do you have any problems obtaining medications due to transportation or finances? yes Insurance coverage: [place] Medicaid  Patient denies hypoglycemic events.  Reported home fasting blood sugars: 160-180 *did not bring meter to clinic  Patient reported dietary habits: Eats 2 meals/day Breakfast: skips d/t finances  Lunch: corn meal, veggies, beans, and meat/fish Dinner: corn meal/rice, veggies, and meat/fish  Snacks: none Drinks: water   Patient denies personal hx of pancreatitis or personal or family hx of thyroid cancer.   DM Prevention:  Statin: Taking, moderate intensity ?  History of albuminuria? yes, last UACR on 03/14/24 = 53 mg/g Last eye exam:  10/2023; No retinopathy present   Tobacco Use:  Tobacco Use: Medium Risk (03/16/2024)   Patient History    Smoking Tobacco Use: Former    Smokeless Tobacco Use: Never    Passive Exposure: Not on file   O:  Vitals:  Wt Readings from Last 3 Encounters:  01/29/24 152 lb 6.4 oz (69.1 kg)  01/03/24 154 lb 3.4 oz (69.9 kg)  12/17/23 152 lb 12.8 oz (69.3 kg)   BP Readings from Last 3 Encounters:  03/14/24 118/80  01/29/24 (!) 123/90  01/03/24 115/83   Pulse Readings from Last 3 Encounters:  03/14/24 82  01/29/24 71  01/03/24 73     Labs:?  Lab Results  Component Value Date   HGBA1C 6.5 (H) 12/17/2023   HGBA1C 7.7 (H) 06/14/2023   HGBA1C 7.6 (H) 01/16/2023   GLUCOSE 204 (H) 12/17/2023   MICRALBCREAT 53 (H) 03/15/2023   CREATININE 1.19 12/17/2023   CREATININE 1.13 08/03/2023   CREATININE 1.14 01/16/2023    Lab Results  Component Value Date   CHOL 161 06/14/2023   LDLCALC 95 06/14/2023   HDL 34 (L) 06/14/2023   TRIG 187 (H) 06/14/2023   ALT 24 12/17/2023   ALT 16 08/03/2023   AST 19 12/17/2023   AST 19 08/03/2023      Chemistry      Component Value Date/Time   NA 139 12/17/2023 1636   K 4.3 12/17/2023 1636   CL 101 12/17/2023 1636   CO2 24 12/17/2023 1636   BUN 9 12/17/2023 1636   CREATININE 1.19 12/17/2023 1636      Component Value Date/Time   CALCIUM  9.2 12/17/2023 1636   ALKPHOS  92 12/17/2023 1636   AST 19 12/17/2023 1636   ALT 24 12/17/2023 1636   BILITOT 0.7 12/17/2023 1636       The 10-year ASCVD risk score (Arnett DK, et al., 2019) is: 14.9%  Lab Results  Component Value Date   MICRALBCREAT 53 (H) 03/15/2023    A/P: Diabetes currently controlled with a most recent A1c of uncontrolled with a most recent A1c of 7.7% on 03/19/24, which is up from 6.5% on 12/17/23. Reported fasting BG readings are above goal at 160-180 mg/dL, however, patient has only been on Janumet  for ~2-3 days. Concern that Janumet  will unlikely be sufficient for management of  BG readings, but patient would like to try for one month consistently. -Continued Janumet  XR 50/1000 mg BID -Counseled that we will likely need to trial Trulicity  if BG remains elevated at follow up in a month. Patient vocalized understanding.  -Extensively discussed pathophysiology of diabetes, recommended lifestyle interventions, dietary effects on blood sugar control -Will send prescription for BG testing supplies. Continue monitoring FBP 2-3x per week and present with meter to follow up  ASCVD risk - primary prevention in patient with diabetes. Last LDL is 95 mg/dL, not at goal of <29 mg/dL.   -Continued rosuvastatin  10 mg daily  Patient verbalized understanding of treatment plan. Total time patient counseling 30 minutes.  Follow-up:  Pharmacist on 04/30/24 PCP clinic visit on 04/30/24  Peyton CHARLENA Ferries, PharmD Clinical Pharmacist [facility] [phone]

## 2024-03-20 ENCOUNTER — Other Ambulatory Visit (HOSPITAL_COMMUNITY): Payer: Self-pay

## 2024-03-20 ENCOUNTER — Other Ambulatory Visit: Payer: Self-pay

## 2024-03-21 ENCOUNTER — Other Ambulatory Visit (HOSPITAL_COMMUNITY): Payer: Self-pay

## 2024-03-21 ENCOUNTER — Ambulatory Visit
Admission: RE | Admit: 2024-03-21 | Discharge: 2024-03-21 | Disposition: A | Source: Ambulatory Visit | Attending: Urology

## 2024-03-21 DIAGNOSIS — N50811 Right testicular pain: Secondary | ICD-10-CM

## 2024-03-31 ENCOUNTER — Ambulatory Visit: Payer: Self-pay | Admitting: Urology

## 2024-04-03 ENCOUNTER — Other Ambulatory Visit: Payer: Self-pay | Admitting: Family Medicine

## 2024-04-03 DIAGNOSIS — K30 Functional dyspepsia: Secondary | ICD-10-CM

## 2024-04-04 ENCOUNTER — Other Ambulatory Visit (HOSPITAL_COMMUNITY): Payer: Self-pay

## 2024-04-04 MED ORDER — PANTOPRAZOLE SODIUM 40 MG PO TBEC: 40.0000 mg | DELAYED_RELEASE_TABLET | Freq: Every day | ORAL | 3 refills | Status: AC

## 2024-04-09 NOTE — Telephone Encounter (Signed)
 Interpreter services for swahili used to advised patient of scrotal US  results. Advised patient of detailed results below, given by provider. Patient advised that overall there were no concerning or urgent findings and to follow-up as needed. Per interpreter services patient verbalized understanding of results given.  Andrea Kirks LPN

## 2024-04-09 NOTE — Telephone Encounter (Signed)
-----   Message from [facility] sent at 03/31/2024  7:13 AM EST ----- Please let Vincent [name] know that everything looks normal on his scrotal ultrasound.  Both testicles are normal in size and appearance. The blood flow going in and out of them is also normal. There  were no signs of swelling, infection, or any abnormal lumps.  On the left side, there is a very small, harmless cyst near the epididymis (a structure next to the testicle). These tiny cysts are  common and do not cause problems.  There were no varicoceles (enlarged veins) and no hydroceles (extra fluid) on either side.  Overall: No concerning or urgent findings. He can follow up as needed.

## 2024-04-11 ENCOUNTER — Encounter: Admitting: Dietician

## 2024-04-17 ENCOUNTER — Other Ambulatory Visit: Payer: Self-pay

## 2024-04-17 NOTE — Patient Outreach (Signed)
 Social Drivers of Health  Community Resource and Care Coordination Visit Note   04/17/2024  Name: Vincent [name] MRN: [id number] DOB:Sep 21, 1960  Situation: Referral received for [facility] needs assessment and assistance related to rent and utilities. I obtained verbal consent from Patient.  Visit completed with Patient on the phone.   Background:      Assessment:   Goals Addressed             This Visit's Progress    BSW Goals       Current SDOH Barriers:  Financial constraints related to no income Housing barriers Utility assistance  Interventions: Referred patient to community resources  Provided patient with resources for rent and utilities via mail and email on file. Patient has received resources. SW encouraged patient to continue to try the rent resources at the beginning of the month. Patient states daughter is waiting for a check but he does not know who the check will be coming from.  Vincent [name], [name], [name] [place]  [facility] Social Worker, Population Health [phone]           Recommendation:   follow up with resources regarding housing needs  Follow Up Plan:   Telephone follow-up 04/25/24 at 1130  Vincent [name], [name], [name] [facility]  [facility] Social Worker, Population Health [phone]

## 2024-04-17 NOTE — Patient Instructions (Signed)
 Visit Information  [name] was given information about Medicaid Managed Care team care coordination services as a part of their Healthy [facility] Medicaid benefit. [name]   If you would like to schedule transportation through your Healthy [facility] plan, please call the following number at least 2 days in advance of your appointment: [phone]  For information about your ride after you set it up, call Ride Assist at [phone]. Use this number to activate a Will Call pickup, or if your transportation is late for a scheduled pickup. Use this number, too, if you need to make a change or cancel a previously scheduled reservation.  If you need transportation services right away, call [phone]. The after-hours call center is staffed 24 hours to handle ride assistance and urgent reservation requests (including discharges) 365 days a year. Urgent trips include sick visits, hospital discharge requests and life-sustaining treatment.  Call the [facility] Line at [phone], at any time, 24 hours a day, 7 days a week. If you are in danger or need immediate medical attention call 911.    Social Worker will follow up on 04/25/24 at 1130am.   [name], BSW, [name] [place]  [facility] Social Worker, Population Health [phone]   Following is a copy of your plan of care:   Goals Addressed             This Visit's Progress    BSW Goals       Current SDOH Barriers:  Financial constraints related to no income Housing barriers Utility assistance  Interventions: Referred patient to community resources  Provided patient with resources for rent and utilities via mail and email on file. Patient has received resources. SW encouraged patient to continue to try the rent resources at the beginning of the month. Patient states daughter is waiting for a check but he does not know who the check will be coming from.  [name], [name],  [name] [place]  [facility] Social Worker, Population Health [phone]

## 2024-04-21 NOTE — Patient Outreach (Signed)
 RNCM contacted patient to reschedule missed appointment using [facility] interpreters -  [place] [phone]. Patient initially wanting t talk about SDOH needs and needed to be reminded multiple times that he just spoke with [name] Rouse on 1/8 and she will call again 04/25/24. Rescheduled and given appointment date and time with Rouse again.

## 2024-04-25 ENCOUNTER — Other Ambulatory Visit: Payer: Self-pay

## 2024-04-25 NOTE — Patient Outreach (Signed)
 Social Drivers of Health  Community Resource and Care Coordination Visit Note   04/25/2024  Name: Vincent [name] MRN: [id number] DOB:06-Aug-1960  Situation: Referral received for [facility] needs assessment and assistance related to [employer] . I obtained verbal consent from Patient.  Visit completed with Patient on the phone.   Background:      Assessment:   Goals Addressed             This Visit's Progress    BSW Goals       Current SDOH Barriers:  Financial constraints related to no income Housing barriers Utility assistance  Interventions: Referred patient to community resources  Provided patient with resources for rent and utilities via mail and email on file. Patient has received resources. SW encouraged patient to continue to try the rent resources at the beginning of the month. Patient states daughter is waiting for a check but he does not know who the check will be coming from. Patient states rent has been paid from his daughter borrowing money from someone, but now he wants his money back. SW encouraged patient to utilize resources provided.   [name], BSW, [name] [place]  [facility] Social Worker, Population Health [phone]           Recommendation:   Utilize resources SW sent for assistance with rent.  Follow Up Plan:   Telephone follow-up 07/12/24  [name], BSW, [name] [place]  [facility] Social Worker, Population Health [phone]

## 2024-04-25 NOTE — Patient Instructions (Signed)
 Visit Information  Vincent [name] was given information about Medicaid Managed Care team care coordination services as a part of their Healthy [facility] Medicaid benefit. Miraj Considine   If you would like to schedule transportation through your Healthy [facility] plan, please call the following number at least 2 days in advance of your appointment: [phone]  For information about your ride after you set it up, call Ride Assist at [phone]. Use this number to activate a Will Call pickup, or if your transportation is late for a scheduled pickup. Use this number, too, if you need to make a change or cancel a previously scheduled reservation.  If you need transportation services right away, call [phone]. The after-hours call center is staffed 24 hours to handle ride assistance and urgent reservation requests (including discharges) 365 days a year. Urgent trips include sick visits, hospital discharge requests and life-sustaining treatment.  Call the [facility] Line at [phone], at any time, 24 hours a day, 7 days a week. If you are in danger or need immediate medical attention call 911.     Social Worker will follow up on 05/14/24 at 11am.   [name], BSW, [name] [place]  [facility] Social Worker, Population Health [phone]   Following is a copy of your plan of care:   Goals Addressed             This Visit's Progress    BSW Goals       Current SDOH Barriers:  Financial constraints related to no income Housing barriers Utility assistance  Interventions: Referred patient to community resources  Provided patient with resources for rent and utilities via mail and email on file. Patient has received resources. SW encouraged patient to continue to try the rent resources at the beginning of the month. Patient states daughter is waiting for a check but he does not know who the check will be coming from. Patient states rent has  been paid from his daughter borrowing money from someone, but now he wants his money back. SW encouraged patient to utilize resources provided.   [name], [name], [name] [place]  [facility] Social Worker, Population Health [phone]

## 2024-04-29 ENCOUNTER — Other Ambulatory Visit: Payer: Self-pay

## 2024-04-29 NOTE — Patient Outreach (Signed)
 Complex Care Management   Visit Note  04/29/2024  Name:  Vincent [name] MRN: [id number] DOB: 16-May-1960  Situation: Referral received for Complex Care Management related to Diabetes with Complications I obtained verbal consent from Patient.  Visit completed with Patient  on the phone  Background:   Past Medical History:  Diagnosis Date   Chronic bilateral low back pain 12/05/2022   Diabetes mellitus without complication (HCC) 11/20/2022   Groin pain, right 10/13/2022   H. 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   07/07/21 Positive T-Spot by [facility]     Right sided abdominal pain 10/13/2022   Right testicular pain 03/15/2023   Swahili language interpreter needed 11/20/2022    Assessment: Patient Reported Symptoms:  Cognitive Cognitive Status: Alert and oriented to person, place, and time Cognitive/Intellectual Conditions Management [RPT]: None reported or documented in medical history or problem list   Health Maintenance Behaviors: Annual physical exam  Neurological Neurological Review of Symptoms: Not assessed    HEENT HEENT Symptoms Reported: Not assessed      Cardiovascular Cardiovascular Symptoms Reported: Not assessed    Respiratory Respiratory Symptoms Reported: Not assesed    Endocrine Endocrine Symptoms Reported: No symptoms reported Is patient diabetic?: Yes Is patient checking blood sugars at home?: Yes List most recent blood sugar readings, include date and time of day: Reports taking Janumet  FBG today 152, reports usually in 120s-140s, denies lows or highs    Gastrointestinal Additional Gastrointestinal Details: patient states still pain down left side into abdomen since accident - no longer taking medication for pain - sttes Duloxetine  made him tired and dizzy, did not report to physician, does not have appointment with [facility] - given number again, PCP notified (patient has PCP appointment tomorrow)      Genitourinary Genitourinary Symptoms Reported: Not assessed    Integumentary Integumentary Symptoms Reported: Not assessed    Musculoskeletal Musculoskelatal Symptoms Reviewed: Back pain, Joint pain, Muscle pain Additional Musculoskeletal Details: patient states pain is still there with the treatments' but unclear what treatments, reports ongoing pain since accident left side from shoulder into chest and abdomen, no longer taking duloxetine  or pregablin - states made him dizzy and weak  - did not notify provider RNCM will send message to PCP, appointment tomorrow   Falls in the past year?: No Number of falls in past year: 1 or less Was there an injury with Fall?: No Fall Risk Category Calculator: 0 Patient Fall Risk Level: Low Fall Risk    Psychosocial Psychosocial Symptoms Reported: Anxiety - if selected complete GAD Additional Psychological Details: Anxiety - RNCM observation, patient focus on financial concerns, unsure of patient comprehension of explanations/information provided, asked if daughter there to assist - currently not home, patient has met multiple times with BSW and been given financial resource information and has follow up scheduled - resources not yet contacted, has met with pharmacist and has follow up scheduled, repeatedly tried to focus on health conditions and patient going back to finances, given numbers again for [facility], [facility] and [facility], aware will message PCP about pain, discussed again at length about need to refill meds by calling pharmacy - they use interpreter services also and if no refills to call doctor's office, worried about getting in debt for medicines aware to discuss with RPH, minimal health assessment completed and patient agreed focus at next visit with be on health assessment  as others are helping him with finances   Major Change/Loss/Stressor/Fears (CP):  Resources      04/29/2024    PHQ2-9 Depression Screening   Little interest or pleasure in doing things    Feeling down, depressed, or hopeless    PHQ-2 - Total Score    Trouble falling or staying asleep, or sleeping too much    Feeling tired or having little energy    Poor appetite or overeating     Feeling bad about yourself - or that you are a failure or have let yourself or your family down    Trouble concentrating on things, such as reading the newspaper or watching television    Moving or speaking so slowly that other people could have noticed.  Or the opposite - being so fidgety or restless that you have been moving around a lot more than usual    Thoughts that you would be better off dead, or hurting yourself in some way    PHQ2-9 Total Score    If you checked off any problems, how difficult have these problems made it for you to do your work, take care of things at home, or get along with other people    Depression Interventions/Treatment      There were no vitals filed for this visit.    Medications Reviewed Today     Reviewed by Devra Lands, RN (Registered Nurse) on 04/29/24 at 1438  Med List Status: <None>   Medication Order Taking? Sig Documenting Provider Last Dose Status Informant  Accu-Chek Softclix Lancets lancets [id number] Yes Use as instructed Simmons-Robinson, [name], MD  Active   Blood Glucose Monitoring Suppl (BLOOD GLUCOSE MONITOR SYSTEM) w/Device KIT [id number] Yes Use as directed up to four times daily. Simmons-Robinson, [name], MD  Active   carbamide peroxide (DEBROX) 6.5 % OTIC solution [id number]  Place 5 drops into both ears 2 (two) times daily.  Patient not taking: Reported on 04/29/2024   Simmons-Robinson, Rockie, MD  Active   cetirizine  (ZYRTEC ) 10 MG tablet [id number]  Take 1 tablet (10 mg total) by mouth daily.  Patient not taking: Reported on 04/29/2024   Simmons-Robinson, Rockie, MD  Active   diclofenac  (VOLTAREN ) 75 MG EC tablet [id number]   Take 75 mg by mouth 2 (two) times daily.  Patient not taking: Reported on 04/29/2024   [provider]  Active   DULoxetine  (CYMBALTA ) 60 MG capsule [id number]  Take 1 capsule (60 mg total) by mouth daily.  Patient not taking: Reported on 04/29/2024   Simmons-Robinson, Rockie, MD  Active   fluticasone  (FLONASE ) 50 MCG/ACT nasal spray [id number]  Place 2 sprays into both nostrils daily.  Patient not taking: Reported on 03/14/2024   Simmons-Robinson, Rockie, MD  Active   fluticasone  (FLONASE ) 50 MCG/ACT nasal spray [id number]  Place 2 sprays into both nostrils daily.  Patient not taking: Reported on 04/29/2024   Simmons-Robinson, Rockie, MD  Active   glucose blood (ACCU-CHEK GUIDE TEST) test strip [id number] Yes Use as instructed Simmons-Robinson, [name], MD  Active   Glucose Blood (BLOOD GLUCOSE TEST STRIPS) STRP [id number] Yes Use up to four times daily as directed Sharma Rockie, MD  Active   Lancet Device MISC [id number] Yes Use up to four times daily as directed. Sharma Rockie, MD  Active   Lancets MISC [id number] Yes Use up to four times daily as directed. Simmons-Robinson, [name], MD  Active   pantoprazole  (PROTONIX ) 40 MG tablet [id number]  Take 1 tablet (40 mg total) by mouth  daily.  Patient not taking: Reported on 04/29/2024   Sharma Coyer, MD  Active   pregabalin  (LYRICA ) 75 MG capsule [id number]  Take 1 capsule (75 mg total) by mouth 2 (two) times daily.  Patient not taking: Reported on 04/29/2024   Simmons-Robinson, Coyer, MD  Active   rosuvastatin  (CRESTOR ) 10 MG tablet [id number]  Take 1 tablet (10 mg total) by mouth daily.  Patient not taking: Reported on 04/29/2024   Simmons-Robinson, Coyer, MD  Active   sildenafil  (REVATIO ) 20 MG tablet [id number]  Take 3-5 tablets about one hour prior to intercourse. Do not exceed 5 tablets per dose.  Patient not taking: Reported on 04/29/2024   Helon Kirsch A, PA-C  Active   sitaGLIPtin -metformin   (JANUMET ) 50-1000 MG tablet [id number] Yes Take 1 tablet by mouth 2 (two) times daily with a meal. Simmons-Robinson, [name], MD  Active             Recommendation:   PCP Follow-up Continue Current Plan of Care  Follow Up Plan:   Telephone follow-up in 1 month  Nestora Duos, MSN, RN [facility]  [facility], [facility] RN Care Manager Direct Dial: [phone] Fax: [phone]

## 2024-04-29 NOTE — Patient Instructions (Signed)
 "   ## **Taarifa za Ziara**  Bwana Margolis alipata taarifa kuhusu huduma za uratibu wa huduma zinazotolewa na timu ya [place] [facility] kama sehemu ya manufaa yao ya Healthy [facility] [place]. **Quadarius RukungiraTHORA Spiller ungependa kupanga usafiri kupitia mpango wako wa Healthy [place], tafadhali piga nambari ifuatayo angalau siku 2 mordechai patricia reynold garth: [phone]**  *   Kwa taarifa kuhusu safari yako baada ya [place], piga Ride Assist kwa [phone]**. Tumia nambari hii [employer], au ikiwa usafiri wako umechelewa kwa muda uliopangwa. Tumia nambari hii pia ikiwa unahitaji kubadili au kughairi miadi ya usafiri uliokwisha kupanga. *   Ikiwa unahitaji [address] [place], [phone]**. Kituo cha huduma baada ya saa za kazi kinafanya kazi saa 24 kwa siku kushughulikia mahitaji ya safari za dharura na maombi ya miadi ya haraka (pamoja na kutoka hospitali) siku 365 kwa mwaka. Safari za dharura ni pamoja na ziara za [place], kutoka hospitali, na safari za matibabu muhimu kwa uhai.  Piga **Laini ya Dharura ya Afya ya Tabia** kwa **[phone]**, wakati wowote, saa 24 kwa siku, siku 7 kwa wiki. Spiller foster kelle hatari au unahitaji huduma ya matibabu mara [place], piga [place]**.  Tafadhali tazama vifaa vya elimu vinavyohusiana na **HAKUNA**, vilivyotolewa kama machapisho.  Mpango wa huduma na maagizo ya ziara vimewasilishwa kwa mgonjwa kwa maneno leo. Mgonjwa anakubali kupokea nakala kwa njia ya barua.  Miadi ya ufuatiliaji kwa njia ya simu na mshiriki wa timu ya Managed Medicaid care management imepangwa kwa: **05/27/2024 saa 2:00 jioni**  **Vincent Duos, MSN, RN**   [place]  [facility]   Meneja wa Candee (RN Care Manager)   Simu ya moja kwa moja: [phone]**  Faksi: UTAH**    ## **Ifuatayo ni nakala ya mpango wako wa huduma:**  ### **Malengo Yanayoshughulikiwa**  ### **Maendeleo ya  Ziara Hii**  *   **Mpango wa Huduma wa VBCI RN - Kisukari (DM)** -- *Eddie vizuri*  **Shida:**  *   Mahitaji ya msaada wa usimamizi wa magonjwa sugu na elimu kuhusiana na Kisukari Aina ya 2 (DMII)  ### **Shon:**  *   Katika siku 90 zijazo, mgonjwa atahudhuria miadi yote iliyopangwa ya kitabibu: uthibitisho kwa mapitio ya taarifa za matibabu *   Caprice webb figures na RN Care Manager na/au Mfanyakazi wa Kijamii kushughulikia mahitaji ya uratibu wa huduma kuhusiana na DMII kama inavyoonekana kwa kuhudhuria miadi za timu ya care management *   Adelaida dawa zote kama zilivyoandikwa na kupiga simu kwa mtoa huduma kwa maswali yanayohusiana na dawa; kupiga simu kwa duka la dawa siku 7 kabla ya dawa teresita ili wendy cindia dozi *   [name] bibber wa msingi wa ugonjwa wa kisukari DMII na mpango wa kujisimamia afya kama inavyoonekana kwa kuangalia FBG na BG ya jioni kila siku, kurekodi, Fernley matokeo kwa PCP, na kupiga simu kwa mtoa huduma ikiwa BG iko juu au chini [place], pamoja na kufuata mlo wa kisukari na kufanya mazoezi  ### **Mikakati ya Uingiliaji (Interventions):**  **Uingiliaji wa Kisukari:**  *   Tathmini ya uelewa wa mgonjwa kuhusu lengo la A1c: **<7%** *   Elimu kuhusu mchakato wa msingi wa ugonjwa wa DM *   Mapitio ya dawa na umuhimu wa kufuata dawa - mgonjwa asema hakuweza kufikia duka la dawa, carnella donath kwa PCP marvia marudio *   Ushauri kuhusu kuangalia sukari ya damu asubuhi akiwa amefunga na jioni, kurekodi, na kupiga simu kwa PCP kwa matokeo nje ya viwango *   Rufaa kutumwa kwa timu ya  famasia kusaidia malipo ya dawa--kuendelea Kincaid na mfamasia na kusaidia mgonjwa kuelewa umuhimu wa kuomba rufaa kwa Elimu ya Kisukari na Lishe Kanosh PCP *   Waylon buffy kwa timu ya kazi ya kijamii kwa mahitaji tata ya kijamii (SDOH) *   Mapitio ya hali ya mgonjwa pamoja na taarifa za madaktari bingwa, vipimo, na dawa DEWAINE [place] wa dalili za msongo wa mawazo unaohusiana  na ugonjwa sugu *   Tathmini ya vikwazo vya kijamii (SDOH) *   Taarifa kuhusu mlo wa terrall hashimoto na kutumwa kwa barua - akiomba rufaa kwa lishe kwa sababu bado ana maswali *   Ufafanuzi - mgonjwa bado anapima sukari kwa njia ya kawaida licha ya kuandikiwa CGM *   Mawasiliano yote yametafsiriwa kwa [place], mkalimani alitumika kwa simu zote *   RNCM, mkalimani na mgonjwa walipiga simu Pain Management kupanga miadi - waliongeza ujumbe; RNCM atawasiliana mara miadi ikipangwa *   A1C imeongezeka - mgonjwa alikuwa ameacha Trulicity , sasa amerudi Janumet  na anakutana na mfamasia; anaripoti FBG **120-150**, anakataa kushuka kwa sukari  ### **Matokeo ya Maabara**  **HGBA1C:** 7.7 (A) -- 03/19/2024    ## **Shughuli za Kujitunza za Mgonjwa:**  *   Kuhudhuria miadi yote ya watoa huduma *   Kupiga simu duka la dawa siku 3-7 kabla ya dawa teresita *   Kupiga simu kwa mtoa huduma kwa maswali au wasiwasi mpya *   [place] dawa kama zilivyoandikwa *   Kufanya kazi na mfanyakazi wa kijamii kushughulikia mahitaji ya uratibu wa huduma *   Kuangalia sukari ya damu asubuhi akiwa amefunga na jioni *   Kuangalia miguu kila siku *   Kuingiza matokeo ya sukari na dawa kelle daftari *   Nieves daftari kwa miadi yote ya kitabibu    ## **Mpango:**  *   Miadi ya ufuatiliaji kwa njia ya simu na mshiriki wa timu ya care management imepangwa kwa: **05/27/2024 saa 2:00 jioni**  Vincent Duos, MSN, RN [place]  [facility], [facility] RN Care Manager Direct Dial: [phone] Fax: [phone]     Visit Information  Mr. Schnackenberg was given information about Medicaid Managed Care team care coordination services as a part of their Healthy [facility] Medicaid benefit. Christina Quintela   If you would like to schedule transportation through your Healthy [facility] plan, please call the following number at least 2 days in advance of your appointment: [phone]  For  information about your ride after you set it up, call Ride Assist at [phone]. Use this number to activate a Will Call pickup, or if your transportation is late for a scheduled pickup. Use this number, too, if you need to make a change or cancel a previously scheduled reservation.  If you need transportation services right away, call [phone]. The after-hours call center is staffed 24 hours to handle ride assistance and urgent reservation requests (including discharges) 365 days a year. Urgent trips include sick visits, hospital discharge requests and life-sustaining treatment.  Call the [facility] Line at [phone], at any time, 24 hours a day, 7 days a week. If you are in danger or need immediate medical attention call 911.   Please see education materials related to NONE provided as print materials.   Care plan and visit instructions communicated with the patient verbally today. Patient agrees to receive a copy via mail.   Telephone follow up appointment with Managed Medicaid care management team member scheduled for: 05/27/2024 at 2:00 pm  Vincent Duos, MSN, RN [facility]  [facility], [facility] RN Care Manager Direct Dial: [phone] Fax: [phone]   Following is a copy of your plan of care:   Goals Addressed             This Visit's Progress    VBCI RN Care Plan - DM   On track    Problems:  Chronic Disease Management support and education needs related to DMII  Goal: Over the next 90 days the Patient will attend all scheduled medical appointments: Patient will attend all appointments as evidenced by chart review        continue to work with RN Care Manager and/or Social Worker to address care management and care coordination needs related to DMII as evidenced by adherence to care management team scheduled appointments     take all medications exactly as prescribed and will call provider for medication related  questions as evidenced by reporting compliance with medications and calling pharmacy 7 days prior to running out so no missed medications    verbalize basic understanding of DMII disease process and self health management plan as evidenced by checking FBG and eve BG and recording daily, take readings to PCP appointment, call provider if having high or low BG or any questions or concerns, follow diabetic diet, exercise  Interventions:   Diabetes Interventions: Assessed patient's understanding of A1c goal: <7% Provided education to patient about basic DM disease process Reviewed medications with patient and discussed importance of medication adherence - patient states could not reach pharmacy, message sent to PCP requesting refills  Advised patient, providing education and rationale, to check cbg fasting in morning and evening and record, calling Primary Care provider for findings outside established parameters Referral made to pharmacy team for assistance with copays - again seeing [facility] re copays, and requesting assist helping patient understand need to request referrals as well as Disease Management DM Education and referral to Nutrition from PCP Referral made to social work team for assistance with Complex SDOH needs Review of patient status, including review of consultants reports, relevant laboratory and other test results, and medications completed Screening for signs and symptoms of depression related to chronic disease state  Assessed social determinant of health barriers Diabetic diet information discussed and sent via mail  - requesting referral to Nutrition as patient ontinues to have questiosn Clarification - patient still checking BG manually though prescribed CGM All correspondence translated into Swahili, interpreter used for all calls RNCM, Interpreter and patient called Pain Management for appointment - left message for Manager Otha Dover, [facility] gave contact info and will notify  patient once appointment is scheduled AIC increased - patient had stopped Trulicity , now back on Janumet  and meeting with RPH, reports FBG ranging from 120s-150's. Denies lows. Lab Results  Component Value Date   HGBA1C 7.7 (A) 03/19/2024    Patient Self-Care Activities:  Attend all scheduled provider appointments Call pharmacy for medication refills 3-7 days in advance of running out of medications Call provider office for new concerns or questions  Take medications as prescribed   Work with the social worker to address care coordination needs and will continue to work with the clinical team to address health care and disease management related needs check blood sugar at prescribed times: fasting in morning and evening as per provider check feet daily for cuts, sores or redness enter blood sugar readings and medication or insulin into daily log take the blood sugar log to all doctor visits  Plan:  Telephone follow up appointment with  care management team member scheduled for:  05/27/2024 at 2:00 pm              "

## 2024-04-30 ENCOUNTER — Ambulatory Visit

## 2024-04-30 ENCOUNTER — Ambulatory Visit: Admitting: Family Medicine

## 2024-04-30 ENCOUNTER — Encounter: Payer: Self-pay | Admitting: Family Medicine

## 2024-04-30 DIAGNOSIS — Z7984 Long term (current) use of oral hypoglycemic drugs: Secondary | ICD-10-CM | POA: Diagnosis not present

## 2024-04-30 DIAGNOSIS — E119 Type 2 diabetes mellitus without complications: Secondary | ICD-10-CM | POA: Diagnosis not present

## 2024-04-30 DIAGNOSIS — Z23 Encounter for immunization: Secondary | ICD-10-CM

## 2024-04-30 NOTE — Progress Notes (Signed)
 Patient is present for 2nd dose of hepatitis B vaccine. Injection was given in L/R deltoid and patient tolerated injection well.   Patient here for Hep B vaccination only.  I did not examine the patient.  I did review patient's medical history, medications, and allergies and vaccine consent form.  CMA gave vaccination. Patient tolerated well.  Rockie Agent, MD  [facility]  04/30/24

## 2024-04-30 NOTE — Progress Notes (Unsigned)
 "  S:     Reason for visit: ?  Vincent [name] is a 64 y.o. male with a history of diabetes (type 2), who presents today for a follow up diabetes pharmacotherapy visit.? Pertinent PMH also includes chronic pain.  Care Team: Primary Care Provider: Sharma Coyer, MD  There has been historical difficulty with paying for Medicaid copays.   At last visit with clinical pharmacist on 02/20/24, reported he received his Janumet  in the mail about 2 days prior. Fasting BG readings were elevated at 160-180 mg/dL at that time.  Today, patient reports his is still not interested in any injectable therapies.   Current diabetes medications include: Janumet  XR 50/1000 mg BID Previous diabetes medications include: Rybelsus  (not covered) Current hypertension medications include: none Current hyperlipidemia medications include: rosuvastatin  10 mg daily  Patient reports adherence to taking all medications as prescribed.  Have you been experiencing any side effects to the medications prescribed? no Do you have any problems obtaining medications due to transportation or finances? yes Insurance coverage: [place] Medicaid  Patient denies hypoglycemic events.  Reported home fasting blood sugars: 120 - 150 mg/dL *did not bring meter to clinic  Patient reported dietary habits: Eats 2 meals/day Breakfast: skips d/t finances  Lunch: corn meal, veggies, beans, and meat/fish Dinner: corn meal/rice, veggies, and meat/fish  Snacks: none Drinks: water   Patient denies personal hx of pancreatitis or personal or family hx of thyroid cancer.   DM Prevention:  Statin: Taking; moderate intensity.?  ACE/ARB: no Last urinary albumin/creatinine ratio:  Lab Results  Component Value Date   MICRALBCREAT 53 (H) 03/15/2023   Last eye exam:     Last foot exam: 03/15/2023 Tobacco Use:  Tobacco Use: Medium Risk (03/16/2024)   Patient History    Smoking Tobacco Use: Former    Smokeless Tobacco Use: Never     Passive Exposure: Not on file     O:  Vitals:  Wt Readings from Last 3 Encounters:  01/29/24 152 lb 6.4 oz (69.1 kg)  01/03/24 154 lb 3.4 oz (69.9 kg)  12/17/23 152 lb 12.8 oz (69.3 kg)   BP Readings from Last 3 Encounters:  03/14/24 118/80  01/29/24 (!) 123/90  01/03/24 115/83   Pulse Readings from Last 3 Encounters:  03/14/24 82  01/29/24 71  01/03/24 73     Labs:?  Lab Results  Component Value Date   HGBA1C 7.7 (A) 03/19/2024   HGBA1C 6.5 (H) 12/17/2023   HGBA1C 7.7 (H) 06/14/2023   GLUCOSE 204 (H) 12/17/2023   MICRALBCREAT 53 (H) 03/15/2023   CREATININE 1.19 12/17/2023   CREATININE 1.13 08/03/2023   CREATININE 1.14 01/16/2023    Lab Results  Component Value Date   CHOL 161 06/14/2023   LDLCALC 95 06/14/2023   HDL 34 (L) 06/14/2023   TRIG 187 (H) 06/14/2023   ALT 24 12/17/2023   ALT 16 08/03/2023   AST 19 12/17/2023   AST 19 08/03/2023      Chemistry      Component Value Date/Time   NA 139 12/17/2023 1636   K 4.3 12/17/2023 1636   CL 101 12/17/2023 1636   CO2 24 12/17/2023 1636   BUN 9 12/17/2023 1636   CREATININE 1.19 12/17/2023 1636      Component Value Date/Time   CALCIUM  9.2 12/17/2023 1636   ALKPHOS 92 12/17/2023 1636   AST 19 12/17/2023 1636   ALT 24 12/17/2023 1636   BILITOT 0.7 12/17/2023 1636       The  10-year ASCVD risk score (Arnett DK, et al., 2019) is: 15.4%  Lab Results  Component Value Date   MICRALBCREAT 53 (H) 03/15/2023    A/P: Diabetes currently controlled with a most recent A1c of uncontrolled with a most recent A1c of 7.7% on 03/19/24, which is up from 6.5% on 12/17/23. Reported fasting BG readings are close to goal at 120-150 mg/dL. Previously discussed switching from DPP4i therapy to a GLP1 to limit prescription copays with various medications, but patient has declined. Will start SGLT2i therapy at this time.  -Continued Janumet  XR 50/1000 mg BID -Started Farxiga  10 mg daily -Extensively discussed  pathophysiology of diabetes, recommended lifestyle interventions, dietary effects on blood sugar control -Patient educated on purpose, proper use, and potential adverse effects of Farxiga .  -Continue monitoring FBP 2-3x per week and present with meter to follow up  ASCVD risk - primary prevention in patient with diabetes. Last LDL is 95 mg/dL, not at goal of <29 mg/dL.   -Continued rosuvastatin  10 mg daily  Patient verbalized understanding of treatment plan. Total time patient counseling 30 minutes.  Follow-up:  Pharmacist on 07/02/24 PCP clinic visit on 06/05/24  Peyton CHARLENA Ferries, PharmD, BCACP, CPP Clinical Pharmacist [facility] [phone]    "

## 2024-05-01 ENCOUNTER — Other Ambulatory Visit: Payer: Self-pay

## 2024-05-01 MED ORDER — DAPAGLIFLOZIN PROPANEDIOL 10 MG PO TABS
10.0000 mg | ORAL_TABLET | Freq: Every day | ORAL | 3 refills | Status: AC
  Filled 2024-05-01: qty 90, 90d supply, fill #0

## 2024-05-08 ENCOUNTER — Encounter: Admitting: Dietician

## 2024-05-08 ENCOUNTER — Encounter: Payer: Self-pay | Admitting: Dietician

## 2024-05-08 DIAGNOSIS — Z7984 Long term (current) use of oral hypoglycemic drugs: Secondary | ICD-10-CM | POA: Diagnosis not present

## 2024-05-08 DIAGNOSIS — E119 Type 2 diabetes mellitus without complications: Secondary | ICD-10-CM | POA: Insufficient documentation

## 2024-05-08 DIAGNOSIS — Z713 Dietary counseling and surveillance: Secondary | ICD-10-CM | POA: Diagnosis not present

## 2024-05-08 NOTE — Progress Notes (Signed)
 Diabetes Self-Management Education  Visit Type: First/Initial  Appt. Start Time: 1415 Appt. End Time: 1555  05/08/2024  [name], identified by name and date of birth, is a 64 y.o. male with a diagnosis of Diabetes: Type 2.   ASSESSMENT Pt spouse, [name], present for appointment. Swahili interpreter from [employer], [place] [phone], present via video and audio virtual platform. Pt reports taking Janumet  50/1000 BID, Farxiga  @10  mg daily, occasionally misses doses.Pt reports financial difficulties, not working due to health conditions. Pt reports significant pain in their side, states it can be a 9/10, occasional vomiting and diarrhea, states it can keep them from eating at times. Checking glucose twice a day, but ay skip a day here and there, unsure as to how to use lancing device, RD provided SMBG instruction, CBG during visit 146 mg/dL Pt reports typical diet of ugali, fish/meat (goat, beef, chicken, pork), limited vegetables (spinach, cassava leaves, cabbage, pumpkin leaves).   Diabetes Self-Management Education - 05/08/24 1548       Visit Information   Visit Type First/Initial      Initial Visit   Diabetes Type Type 2    Date Diagnosed 2024    Are you currently following a meal plan? No    Are you taking your medications as prescribed? Yes   Occasionally misses doses     Psychosocial Assessment   Patient Belief/Attitude about Diabetes Defeat/Burnout    What is the hardest part about your diabetes right now, causing you the most concern, or is the most worrisome to you about your diabetes?   Taking/obtaining medications;Checking blood sugar;Making healty food and beverage choices    Self-care barriers English as a second language;Lack of material resources   Financial difficulty   Self-management support Doctor's office;Family    Other persons present Patient;Spouse/SO;Interpreter    Patient Concerns Nutrition/Meal planning;Medication;Monitoring    Special  Needs Other (comment)    Preferred Learning Style Auditory;Visual    Learning Readiness Ready    How often do you need to have someone help you when you read instructions, pamphlets, or other written materials from your doctor or pharmacy? 5 - Always      Pre-Education Assessment   Patient understands the diabetes disease and treatment process. Needs Instruction    Patient understands incorporating nutritional management into lifestyle. Needs Instruction    Patient undertands incorporating physical activity into lifestyle. Needs Instruction    Patient understands using medications safely. Needs Instruction    Patient understands monitoring blood glucose, interpreting and using results Needs Instruction    Patient understands prevention, detection, and treatment of acute complications. Needs Instruction    Patient understands prevention, detection, and treatment of chronic complications. Needs Instruction    Patient understands how to develop strategies to address psychosocial issues. Needs Instruction    Patient understands how to develop strategies to promote health/change behavior. Needs Instruction      Complications   Last HgB A1C per patient/outside source 7.7 %    How often do you check your blood sugar? 3-4 times / week    Fasting Blood glucose range (mg/dL) 869-820    Postprandial Blood glucose range (mg/dL) 819-799      Dietary Intake   Snack (morning) Biscuits    Lunch Ugali, Fish    Snack (afternoon) Tea    Snack (evening) Coffee    Beverage(s) Water, Coffee, Tea      Activity / Exercise   Activity / Exercise Type ADL's    How many  days per week do you exercise? 0    How many minutes per day do you exercise? 0    Total minutes per week of exercise 0      Patient Education   Previous Diabetes Education No    Disease Pathophysiology Explored patient's options for treatment of their diabetes    Healthy Eating Plate Method    Medications Reviewed patients medication for  diabetes, action, purpose, timing of dose and side effects.    Monitoring Taught/evaluated SMBG meter.;Identified appropriate SMBG and/or A1C goals.    Diabetes Stress and Support Helped patient identify a support system for diabetes management      Individualized Goals (developed by patient)   Nutrition Follow meal plan discussed    Medications take my medication as prescribed    Monitoring  Test my blood glucose as discussed    Problem Solving Eating Pattern;Addressing barriers to behavior change    Reducing Risk examine blood glucose patterns    Health Coping Ask for help with psychological, social, or emotional issues      Post-Education Assessment   Patient understands the diabetes disease and treatment process. Needs Review    Patient understands incorporating nutritional management into lifestyle. Needs Review    Patient undertands incorporating physical activity into lifestyle. Needs Review    Patient understands using medications safely. Needs Review    Patient understands monitoring blood glucose, interpreting and using results Needs Review    Patient understands prevention, detection, and treatment of acute complications. Needs Review    Patient understands prevention, detection, and treatment of chronic complications. Needs Review    Patient understands how to develop strategies to address psychosocial issues. Needs Review    Patient understands how to develop strategies to promote health/change behavior. Needs Review      Outcomes   Expected Outcomes Demonstrated interest in learning but significant barriers to change    Future DMSE PRN    Program Status Not Completed          Individualized Plan for Diabetes Self-Management Training:   Learning Objective:  Patient will have a greater understanding of diabetes self-management. Patient education plan is to attend individual and/or group sessions per assessed needs and concerns.   Plan:   Patient Instructions  Cut  your ugali ball in half and have half early in the day and half later in the day. Splitting this in half will help to keep your blood sugar from going too high.  Eat more vegetables (Spinach, cabbage, leaves) with all of your meals.  Have meats with as little fat as you can.  Take your medications daily as prescribed.  Check your blood sugar each morning before eating or drinking (fasting). Look for numbers under 130 mg/dL Check your blood sugar 2 hours after you begin eating a meal. Look for numbers under 180 mg/dL at all times.         Expected Outcomes:  Demonstrated interest in learning but significant barriers to change  Education material provided: Plate Model (Swahili), AVS instructions (Swahili)  If problems or questions, patient to contact team via:  Phone and Email  Future DSME appointment: PRN

## 2024-05-08 NOTE — Patient Instructions (Addendum)
 Cut your ugali ball in half and have half early in the day and half later in the day. Splitting this in half will help to keep your blood sugar from going too high.  Eat more vegetables (Spinach, cabbage, leaves) with all of your meals.  Have meats with as little fat as you can.  Take your medications daily as prescribed.  Check your blood sugar each morning before eating or drinking (fasting). Look for numbers under 130 mg/dL Check your blood sugar 2 hours after you begin eating a meal. Look for numbers under 180 mg/dL at all times.

## 2024-05-14 ENCOUNTER — Other Ambulatory Visit: Payer: Self-pay

## 2024-05-14 NOTE — Patient Outreach (Signed)
 [facility]  Community Resource and Care Coordination Visit Note   05/14/2024  Name: Prophet Renwick MRN: [id number] DOB:Aug 15, 1960  Situation: Referral received for [facility] needs assessment and assistance related to Housing . I obtained verbal consent from Patient.  Visit completed with Parent on the phone.   Background:      Assessment:   Goals Addressed             This Visit's Progress    COMPLETED: BSW Goals       Current SDOH Barriers:  Financial constraints related to no income Housing barriers Utility assistance  Interventions: Referred patient to community resources  Provided patient with resources for rent and utilities via mail and email on file. Patient has received resources. SW encouraged patient to continue to try the rent resources at the beginning of the month. Patient states daughter is waiting for a check but he does not know who the check will be coming from. Patient states rent has been paid from his daughter borrowing money from someone, but now he wants his money back. SW encouraged patient to utilize resources provided.  Patient will continue to try resources for assistance.  [name], [name], [name] [place]  [facility] Social Worker, Population Health [phone]           Recommendation:   follow up with resources regarding housing needs  Follow Up Plan:   Patient has achieved all patient stated goals. [employer] will be closed. Patient has been provided contact information should new needs arise.   [name], [name], [name] [place]  [facility] Social Worker, Population Health [phone]

## 2024-05-14 NOTE — Patient Instructions (Signed)
 Visit Information  Mr. Holaway was given information about Medicaid Managed Care team care coordination services as a part of their Healthy [facility] Medicaid benefit. Larenz Wrenn   If you would like to schedule transportation through your Healthy [facility] plan, please call the following number at least 2 days in advance of your appointment: [phone]  For information about your ride after you set it up, call Ride Assist at [phone]. Use this number to activate a Will Call pickup, or if your transportation is late for a scheduled pickup. Use this number, too, if you need to make a change or cancel a previously scheduled reservation.  If you need transportation services right away, call [phone]. The after-hours call center is staffed 24 hours to handle ride assistance and urgent reservation requests (including discharges) 365 days a year. Urgent trips include sick visits, hospital discharge requests and life-sustaining treatment.  Call the [facility] Line at [phone], at any time, 24 hours a day, 7 days a week. If you are in danger or need immediate medical attention call 911.   The  Patient                                              has been provided with contact information for the Managed Medicaid care management team and has been advised to call with any health related questions or concerns.   [name], BSW, [name] [place]  [facility] Social Worker, Population Health [phone]    Following is a copy of your plan of care:   Goals Addressed             This Visit's Progress    COMPLETED: BSW Goals       Current SDOH Barriers:  Financial constraints related to no income Housing barriers Utility assistance  Interventions: Referred patient to community resources  Provided patient with resources for rent and utilities via mail and email on file. Patient has received resources. SW encouraged patient to continue to  try the rent resources at the beginning of the month. Patient states daughter is waiting for a check but he does not know who the check will be coming from. Patient states rent has been paid from his daughter borrowing money from someone, but now he wants his money back. SW encouraged patient to utilize resources provided.  Patient will continue to try resources for assistance.  [name], [name], [name] [place]  [facility] Social Worker, Population Health [phone]

## 2024-05-27 ENCOUNTER — Telehealth

## 2024-06-05 ENCOUNTER — Ambulatory Visit: Admitting: Family Medicine

## 2024-06-11 ENCOUNTER — Telehealth

## 2024-07-02 ENCOUNTER — Ambulatory Visit

## 2024-10-09 ENCOUNTER — Encounter: Admitting: Dietician
